# Patient Record
Sex: Male | Born: 1970 | Race: White | Hispanic: No | Marital: Married | Smoking: Current some day smoker
Health system: Southern US, Community
[De-identification: ages and names within clinical notes are randomized; demographics above are authoritative.]

## PROBLEM LIST (undated history)

## (undated) DIAGNOSIS — K5792 Diverticulitis of intestine, part unspecified, without perforation or abscess without bleeding: Secondary | ICD-10-CM

## (undated) DIAGNOSIS — N200 Calculus of kidney: Secondary | ICD-10-CM

## (undated) HISTORY — PX: WRIST ARTHROSCOPY: SHX838

---
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
## 2004-04-30 HISTORY — PX: OTHER SURGICAL HISTORY: SHX169

## 2009-04-30 HISTORY — PX: CYSTOSCOPY: SUR368

## 2009-04-30 HISTORY — PX: OTHER SURGICAL HISTORY: SHX169

## 2009-12-22 ENCOUNTER — Ambulatory Visit: Payer: Self-pay | Admitting: Sports Medicine

## 2009-12-22 DIAGNOSIS — M25569 Pain in unspecified knee: Secondary | ICD-10-CM

## 2009-12-22 DIAGNOSIS — IMO0002 Reserved for concepts with insufficient information to code with codable children: Secondary | ICD-10-CM

## 2009-12-29 ENCOUNTER — Ambulatory Visit: Payer: Self-pay | Admitting: Sports Medicine

## 2010-02-02 ENCOUNTER — Ambulatory Visit: Payer: Self-pay | Admitting: Sports Medicine

## 2010-03-20 ENCOUNTER — Ambulatory Visit: Payer: Self-pay | Admitting: Sports Medicine

## 2010-05-30 NOTE — Assessment & Plan Note (Signed)
Summary: FU KNEE PAIN/MJD   Vital Signs:  Patient profile:   40 year old male BP sitting:   127 / 84  Vitals Entered By: Lillia Pauls CMA (December 29, 2009 1:41 PM)  History of Present Illness: 1. F/U left knee injury - Pt with a left knee injury 2 weeks ago.  This happened when he was playing basketball.  Diagnosed with a left knee medial meniscus irritation.  At his visit last wee conservative treatment was prescribed including rest, quad strengthening exercises, pain medications, and a supporting knee brace.  He is 20% better since then.  He has increased range of motion and can now fully extend his knee.  He has been doing his strengthening exercises.  He is able to ambulate on his knee but he does have to be cautious.  He does notice that under his patella he feels a catching and instability occassionally.  He hasn't noticed the knee giving out.  The swelling has also improved.   Current Medications (verified): 1)  Vicodin 5-500 Mg Tabs (Hydrocodone-Acetaminophen) .... 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  well appearing, no acute distress Lungs:  normal respiratory effort.  normal respiratory effort.   Msk:  Left Knee: Minimal medial effusio. No erythema or warmth to palpation. Medial joint line tenderness, with point tenderness at anteriomedial joint line. No tenderness along patella or patellar tendon. Full knee extension actively and passively.  Flexion to 120 degrees.. ACL, PCL, and collateral ligaments intact.  Positive bounce / hold test.  Negative McMurray's.  5/5 strength on hip flexion, knee extension and flexion Patient's gait favors his right leg and he flexes his left knee with caution.  Pulses:  2+ DP pulses Extremities:  no lower extremity edema   Impression & Recommendations:  Problem # 1:  MEDIAL MENISCUS TEAR, LEFT  (ICD-836.0) Assessment Improved 20% improved with conservative management.  Will continue with conservative management for now.  Continue quad strengthening exercises and use of knee brace.  Encouraged him to use stationary bike to work on decreasing the effusion.  Follow up in clinic in 2 weeks.  Problem # 2:  KNEE PAIN, LEFT (ICD-719.46)  His updated medication list for this problem includes:    Vicodin 5-500 Mg Tabs (Hydrocodone-acetaminophen) .Marland Kitchen... Take one tab q4-6 hours  this is improved but still needs pain meds once or twice daily  Complete Medication List: 1)  Vicodin 5-500 Mg Tabs (Hydrocodone-acetaminophen) .... Take one tab q4-6 hours Prescriptions: VICODIN 5-500 MG TABS (HYDROCODONE-ACETAMINOPHEN) take one tab q4-6 hours  #40 x 0   Entered by:   Lillia Pauls CMA   Authorized by:   Enid Baas MD   Signed by:   Lillia Pauls CMA on 12/29/2009   Method used:   Print then Give to Patient   RxID:   [id number] VICODIN 5-500 MG TABS (HYDROCODONE-ACETAMINOPHEN) take one tab q4-6 hours  #40 x 0   Entered by:   Lillia Pauls CMA   Authorized by:   Enid Baas MD   Signed by:   Lillia Pauls CMA on 12/29/2009   Method used:   Print then Give to Patient   RxID:   [id number]

## 2010-05-30 NOTE — Assessment & Plan Note (Signed)
Summary: F/U meniscal tear   Vital Signs:  Patient profile:   40 year old male BP sitting:   128 / 80  Vitals Entered By: Lillia Pauls CMA (March 20, 2010 8:36 AM)  CC:  left meniscal tear improved.  History of Present Illness: 40 y/o M who initially injured himself playing basketball. He had a left meniscal tear that has improved with conservative treatment. He is still doing his exercises. He is feeling about 10-15% improved since his last visit. However, he can still not squat comfortably. He did fall in a hole about 10 days ago while working in his yard. He did not have any soreness but feels like he hyperextended his knee a little.   Current Medications (verified): 1)  None  Review of Systems          Physical Exam  General:  Well-developed,well-nourished,in no acute distress; alert,appropriate and cooperative throughout examination Msk:  no joint swelling, no warmth or erythema. no effusion.  neg Thesselay neg McMurray's  neg Lachman's tests.    Impression & Recommendations:  Problem # 1:  MEDIAL MENISCUS TEAR, LEFT (ICD-836.0) Assessment Improved Pt is feeling much better. Plan to see him back as needed. At this point he can start playing some light basketball and shooting. See instructions for further details.   Problem # 2:  KNEE PAIN, LEFT (ICD-719.46) Assessment: Improved Pt's pain is improved. He has not had to take any Vicodin. Plan to d/c this med from his list.   The following medications were removed from the medication list:    Vicodin 5-500 Mg Tabs (Hydrocodone-acetaminophen) .Marland Kitchen... Take one tab q4-6 hours  Patient Instructions: 1)  Remember to not go more than 45 degrees on squats or lunges.  2)  You can start shooting basketball and doing a few drills while using the brace.  3)  Remember to use the stationary bike for some fitness training.  4)  Walking in the woods on trails is fine too.  5)  Be careful to not allow it to do any sudden twists.      Orders Added: 1)  Est. Patient Level III [16109]

## 2010-05-30 NOTE — Assessment & Plan Note (Signed)
Summary: f/u,mc   Vital Signs:  Patient profile:   40 year old male BP sitting:   126 / 85  Vitals Entered By: Lillia Pauls CMA (February 02, 2010 8:55 AM)  History of Present Illness: 40 yo M with L meniscal tear 6 weeks ago here for follow up. Patient has been doing quad strengthening as well as walking. Reports 25% improvement. Still feels instability in L knee with squatting, going down stairs and going down hills. Patient had one episode where he was pushing a shopping cart and had the knee give out. He was able to catch himself and did not have pain or swelling after the incident.  Physical Exam  General:  Well-developed,well-nourished,in no acute distress; alert,appropriate and cooperative throughout examination Msk:  Knee: Knees symmetric. L knee without swelling or evidence of effusion from previous exam. On passive extension, patient still 5 degrees from full extension, but improved. Able to achieve full extension with light pressure and when examiner supports leg behind heel.  5/5 strength with flexion/extension at knee.  McMurray's negative, clicking behind kneecap not in meniscus. Lachman's negative, Drawer negative.  Thessaly test with pain of 1-2.   Impression & Recommendations:  Problem # 1:  MEDIAL MENISCUS TEAR, LEFT (ICD-836.0) Improvement with exercises and rest. Adivsed patient to begin dynamic exercises including one-leg step up, one-leg step down, side step up and cone touch exercises. He will follow up in 6 weeks for recheck of strength, stability, pain. If continued improvement, may be able to try restarting basketball.  Over next year will suggest using [employer] for sports  Complete Medication List: 1)  Vicodin 5-500 Mg Tabs (Hydrocodone-acetaminophen) .... Take one tab q4-6 hours

## 2010-05-30 NOTE — Assessment & Plan Note (Signed)
Summary: NP L KNEE PAIN X THURS PLAYING BBALL/MJD   Vital Signs:  Patient profile:   40 year old male Height:      74 inches Weight:      217 pounds BMI:     27.96 BP sitting:   127 / 93  Vitals Entered By: Lillia Pauls CMA (December 22, 2009 11:11 AM)  History of Present Illness: Mr. Eric Ayers is a 40 year old male who presents today with left knee pain. The pain started one week ago while playing basketball, without any specific inciting injury. The injury probably related to some stiffness he felt in 4 th game. played a 5th game and very stiff on ride home.  The next day he noted marked soreness and swelling in that knee. He had some improvement over the weekend, but continues to experience diffuse knee pain with some tenderness along the medial aspect of the knee. His pain is worst with going down stairs or squatting and he has noticed that he cannot fully extend the knee. He has had a few episodes in which the knee clicks and seems to be weak or give-way.   Mr. [name] has had no previous injuries or surgery to his left knee.  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 No erythema or warmth to palpation. Joint line tenderness medial>lateral, with point tenderness at anteriomedial joint line. No tenderness along patella or patellar tendon. Knee extension to -5 degrees and limited flexion as well. ACL, PCL, and collateral ligaments intact. Negative Lachman. McMurray's test elicited some pain and Thessaly test induces significant pain at medial knee. 5/5 strength on hip flexion, knee extension and flexion Patient's gait favors his right leg and he flexes his left knee with caution. Additional Exam:  Right Knee [place]: Moderate suprapatellar effusion. Normal patellar tendon.Norm quad tendon.   Medial meniscus shows some calcification and an area of splitting without significantly increased vascularity on doppler. Lateral meniscus appears normal.  Images saved.    Impression & Recommendations:  Problem # 1:  KNEE PAIN, LEFT (EAV-409.81) Mr. Samples presents with a history of left knee pain and swelling and signs of effusion, joint line tenderness, and ultrasound findings that suggest his knee pain is the result of irritation of his meniscus. We will manage conservatively for one week: ice, brace, quadricpes strenghtening. Then we will see him back to evaluate his range of motion and determine the need for surgical evaluation. Orders: Patella / Knee brace ([id number]) [place] LIMITED ([id number])  Problem # 2:  MEDIAL MENISCUS TEAR, LEFT (ICD-836.0)  The patient has a history of chronic mild left knee pain with a more acute onset of pain and swelling that is consistent with an aggrivation or tearing of a damaged medial meniscus. The patient is to start wearing a patellar knee brace to help stabilize the knee and reduce the edema over the next week. He is also to continue to ice the knee and begin quad sets and straight leg lifts to strengthen his quadriceps and improve his range of motion. Mr. [name] will return to clinic in one week to evaluate for improvement in swelling and range of motion. If he remains restricted in extension, we will consider referral for possible further imaging or arthroscopy.  Orders: [place] LIMITED ([id number])  Patient Instructions: 1)  You appear to have some tearing and irritation of  your left medial meniscus with some moderate knee swelling. 2)  For the next week, continue to ice to bring down the swelling, avoid steps and rest. 3)  Do quad sets and straight leg lifts each day. 4)  Wear your knee brace at all times except while sleeping. 5)  Please return to clinic in one week in order to assess the swelling and your range of motion.

## 2010-07-10 ENCOUNTER — Ambulatory Visit (HOSPITAL_COMMUNITY)
Admission: RE | Admit: 2010-07-10 | Discharge: 2010-07-10 | Disposition: A | Discharge status: Home / Self Care | Payer: 59 | Source: Ambulatory Visit | Attending: Urology | Admitting: Urology

## 2010-07-10 DIAGNOSIS — N200 Calculus of kidney: Secondary | ICD-10-CM | POA: Insufficient documentation

## 2010-07-23 ENCOUNTER — Emergency Department (HOSPITAL_COMMUNITY): Payer: 59

## 2010-07-23 ENCOUNTER — Emergency Department (INDEPENDENT_AMBULATORY_CARE_PROVIDER_SITE_OTHER): Payer: 59

## 2010-07-23 ENCOUNTER — Emergency Department (HOSPITAL_BASED_OUTPATIENT_CLINIC_OR_DEPARTMENT_OTHER)
Admission: EM | Admit: 2010-07-23 | Discharge: 2010-07-23 | Disposition: A | Discharge status: Nursing Home (Includes ICF) | Payer: 59 | Attending: Emergency Medicine | Admitting: Emergency Medicine

## 2010-07-23 ENCOUNTER — Ambulatory Visit (HOSPITAL_COMMUNITY)
Admission: EM | Admit: 2010-07-23 | Discharge: 2010-07-23 | Disposition: A | Discharge status: Home / Self Care | Payer: 59 | Attending: Emergency Medicine | Admitting: Emergency Medicine

## 2010-07-23 DIAGNOSIS — N200 Calculus of kidney: Secondary | ICD-10-CM | POA: Insufficient documentation

## 2010-07-23 DIAGNOSIS — R1110 Vomiting, unspecified: Secondary | ICD-10-CM

## 2010-07-23 DIAGNOSIS — N201 Calculus of ureter: Secondary | ICD-10-CM | POA: Insufficient documentation

## 2010-07-23 DIAGNOSIS — R109 Unspecified abdominal pain: Secondary | ICD-10-CM | POA: Insufficient documentation

## 2010-07-23 DIAGNOSIS — Z01810 Encounter for preprocedural cardiovascular examination: Secondary | ICD-10-CM | POA: Insufficient documentation

## 2010-07-23 LAB — CBC
HCT: 42.6 % (ref 39.0–52.0)
MCH: 29.7 pg (ref 26.0–34.0)
MCV: 85 fL (ref 78.0–100.0)
RBC: 5.01 MIL/uL (ref 4.22–5.81)
RDW: 12.2 % (ref 11.5–15.5)
WBC: 17 10*3/uL — ABNORMAL HIGH (ref 4.0–10.5)

## 2010-07-23 LAB — BASIC METABOLIC PANEL
BUN: 16 mg/dL (ref 6–23)
Calcium: 9 mg/dL (ref 8.4–10.5)
Chloride: 104 mEq/L (ref 96–112)
GFR calc non Af Amer: 52 mL/min — ABNORMAL LOW (ref >60)
Glucose, Bld: 116 mg/dL — ABNORMAL HIGH (ref 70–99)
Potassium: 4.1 mEq/L (ref 3.5–5.1)

## 2010-07-23 LAB — DIFFERENTIAL
Eosinophils Absolute: 0.1 10*3/uL (ref 0.0–0.7)
Eosinophils Relative: 1 % (ref 0–5)
Lymphocytes Relative: 8 % — ABNORMAL LOW (ref 12–46)
Lymphs Abs: 1.4 10*3/uL (ref 0.7–4.0)
Monocytes Relative: 9 % (ref 3–12)

## 2010-07-23 LAB — URINALYSIS, ROUTINE W REFLEX MICROSCOPIC
Hgb urine dipstick: NEGATIVE
Nitrite: NEGATIVE
Specific Gravity, Urine: 1.019 (ref 1.005–1.030)
Urobilinogen, UA: 0.2 mg/dL (ref 0.0–1.0)

## 2010-07-31 ENCOUNTER — Ambulatory Visit (HOSPITAL_COMMUNITY)
Admission: RE | Admit: 2010-07-31 | Discharge: 2010-07-31 | Disposition: A | Discharge status: Home / Self Care | Payer: 59 | Source: Ambulatory Visit | Attending: Urology | Admitting: Urology

## 2010-07-31 DIAGNOSIS — Z79899 Other long term (current) drug therapy: Secondary | ICD-10-CM | POA: Insufficient documentation

## 2010-07-31 DIAGNOSIS — R109 Unspecified abdominal pain: Secondary | ICD-10-CM | POA: Insufficient documentation

## 2010-07-31 DIAGNOSIS — N201 Calculus of ureter: Secondary | ICD-10-CM | POA: Insufficient documentation

## 2010-07-31 DIAGNOSIS — M129 Arthropathy, unspecified: Secondary | ICD-10-CM | POA: Insufficient documentation

## 2010-07-31 DIAGNOSIS — N281 Cyst of kidney, acquired: Secondary | ICD-10-CM | POA: Insufficient documentation

## 2010-07-31 DIAGNOSIS — N200 Calculus of kidney: Secondary | ICD-10-CM | POA: Insufficient documentation

## 2010-07-31 DIAGNOSIS — Z87442 Personal history of urinary calculi: Secondary | ICD-10-CM | POA: Insufficient documentation

## 2010-08-09 ENCOUNTER — Ambulatory Visit (HOSPITAL_BASED_OUTPATIENT_CLINIC_OR_DEPARTMENT_OTHER)
Admission: RE | Admit: 2010-08-09 | Discharge: 2010-08-09 | Disposition: A | Discharge status: Home / Self Care | Payer: 59 | Source: Ambulatory Visit | Attending: Urology | Admitting: Urology

## 2010-08-09 DIAGNOSIS — N201 Calculus of ureter: Secondary | ICD-10-CM | POA: Insufficient documentation

## 2010-08-09 DIAGNOSIS — N200 Calculus of kidney: Secondary | ICD-10-CM | POA: Insufficient documentation

## 2010-08-10 LAB — POCT HEMOGLOBIN-HEMACUE: Hemoglobin: 14.9 g/dL (ref 13.0–17.0)

## 2010-08-15 NOTE — Op Note (Signed)
NAMECONWAY, FEDORA               ACCOUNT NO.:  [id number]  MEDICAL RECORD NO.:  [id number]           PATIENT TYPE:  LOCATION:                                 FACILITY:  PHYSICIAN:  Bertram Millard. Jamayia Croker, M.D.DATE OF BIRTH:  April 17, 1971  DATE OF PROCEDURE:  07/23/2010 DATE OF DISCHARGE:                              OPERATIVE REPORT   PREOPERATIVE DIAGNOSIS:  Obstructing left proximal ureteral stones.  POSTOPERATIVE DIAGNOSIS:  Obstructing left proximal ureteral stones.  PRINCIPAL PROCEDURE:  Cystoscopy, left retrograde ureteral pyelogram, double-J stent placement (6-French x 26 cm without string).  SURGEON:  Bertram Millard. Pieper Kasik, M.D.  ANESTHESIA:  General with LMA.  COMPLICATIONS:  None.  BRIEF HISTORY:  A 40 year old male about 2 weeks out from lithotripsy of 12 mm proximal left ureteral stone.  The patient has had significant pain over the past 24 to 48 hours, necessitating emergency room visits, and then eventual transferred to the [facility] emergency room for further management.  He has had quite a bit of Dilaudid today, as well as Toradol.  The patient still has pain.  He has 2 proximal left ureteral stones, with significant hydronephrosis.  He also has a large left parapelvic cyst.  It was recommended that the patient have double-J stent placement, as he has not fit to be a lithotripsy candidate tomorrow because the Toradol administration recently.  Risks and complications of double-J stent placement were discussed with the patient and his wife.  They understand these and desire to proceed.  DESCRIPTION OF PROCEDURE:  The patient was identified in the holding area, the surgical side was marked and he received preoperative IV Ancef.  He was taken to the operating room where general anesthetic was administered with LMA.  Placed in the dorsal lithotomy position. Genitalia and perineum were prepped and draped.  Time-out was then performed.  Procedure was then  commenced.  A 22-French panendoscope was advanced under direct vision through his urethra.  Urethra without lesions or strictures.  His prostate was nonobstructive.  Bladder was entered and inspected circumferentially.  No tumors, trabeculations, or foreign bodies were noted.  A left ureteral orifice was cannulated with an open- ended catheter and retrograde performed.  This showed a totally normal left mid distal ureter.  Proximally, there was at least one filling defect with a moderate left pelvocaliectasis.  I then guided a guidewire by this filling defect, and over top of this, after the open-ended catheter was removed, I placed a 6-French x 26 cm contour stent.  Good proximal and distal curls were seen when the guidewire was removed.  The bladder was then drained, the procedure terminated.  The patient tolerated the procedure well.  He was awakened and then taken to the PACU in stable condition.  He will follow up with lithotripsy later this week with Dr. Vonita Moss. He was discharged on Vesicare 5 mg daily, p.r.n. Zofran, Percocet, and 3 days of Bactrim DS one p.o. b.i.d.   Bertram Millard. Retta Diones, M.D.     SMD/MEDQ  D:  07/23/2010  T:  07/24/2010  Job:  [id number]  Electronically Signed by Marcine Matar M.D. on  08/15/2010 01:18:29 PM

## 2010-08-17 NOTE — Op Note (Signed)
Eric Ayers, Eric Ayers               ACCOUNT NO.:  [id number]  MEDICAL RECORD NO.:  [id number]           PATIENT TYPE:  LOCATION:                                 FACILITY:  PHYSICIAN:  Maretta Bees. Vonita Moss, M.D.DATE OF BIRTH:  [birth year]  DATE OF PROCEDURE:  08/09/2010 DATE OF DISCHARGE:                              OPERATIVE REPORT   PREOPERATIVE DIAGNOSIS:  Left ureteral and renal calculi.  POSTOPERATIVE DIAGNOSIS:  Left ureteral and renal calculi.  PROCEDURE:  Cystoscopy, left ureteroscopy, laser fragmentation and basketing of left upper ureteral and left renal calculi, left retrograde pyelogram with interpretation and insertion of left double-J catheter.  SURGEON:  Maretta Bees. Vonita Moss, M.D.  ANESTHESIA:  General.  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  A large 6- mm fragment was obstructed in the left upper ureter and he had placement of a double-J catheter.  He had repeat lithotripsy with only partial fragmentation of the stone.  He has a double-J catheter in and he is uncomfortable with that.  KUB showed large residual stone fragment in the left upper ureter adjacent to the stent and a cluster of small lower pole renal calculi.  He is brought to the OR today for further treatment and evaluation and was advised about postop bleeding, infection, etc. He was also told about possibility of double-J catheter.  DESCRIPTION OF PROCEDURE:  The patient was brought to the operating room and placed in lithotomy position.  External genitalia prepped and draped in usual fashion.  He was cystoscoped and the distal end of the left double-J catheter was grasped with a grasper and brought out per penis. Through the double-J catheter, I inserted a retractable core guidewire to just below the stone.  I then inserted a digital ureteroscopic access sheath.  I then used the digital ureteroscope and  found a golden yellow stone in the left upper ureter.  There was ureteral inflammation and edema.  There was one stone fragment that I removed with the basket and then the rest of stone, although partially fragmented, was not small enough to remove intact.  Therefore, I used the laser and broke up the stone in several small pieces which were then retrieved through the access sheath with a basket.  At this point, the upper ureter was inflamed but there was no evidence of perforation and later on a retrograde pyelogram showed no evidence of extravasation in the ureter. I then manipulated the ureteroscope into the kidney and basketed three small stones out of the lower pole calix.  There were 2 other tiny pieces of stone that would be too small to basket but the larger fragments were removed from the kidney.  Then I reobserved the ureter and again it looked inflamed but intact.  I reinjected contrast and there was some partial fullness of the pyelocaliceal system with no extravasation in the kidney or ureter.  I then back loaded a retractable guidewire placed through the access sheath and then back loaded it through the cystoscope so I could place a 6-French 26-cm double-J catheter with  full coil in the renal collecting system and a full coil in the bladder and string brought out per urethra which was taped to the penis with anesthesia tape.  Stone fragments were later given to the patient.  He was taken to recovery room in good condition having tolerated the procedure well with minimal bleeding.     Maretta Bees. Vonita Moss, M.D.     LJP/MEDQ  D:  08/09/2010  T:  08/09/2010  Job:  [id number]  Electronically Signed by Larey Dresser M.D. on 08/17/2010 05:55:51 PM

## 2015-08-30 ENCOUNTER — Encounter (HOSPITAL_BASED_OUTPATIENT_CLINIC_OR_DEPARTMENT_OTHER): Payer: Self-pay | Admitting: *Deleted

## 2015-08-30 ENCOUNTER — Emergency Department (HOSPITAL_BASED_OUTPATIENT_CLINIC_OR_DEPARTMENT_OTHER): Payer: BLUE CROSS/BLUE SHIELD

## 2015-08-30 ENCOUNTER — Emergency Department (HOSPITAL_BASED_OUTPATIENT_CLINIC_OR_DEPARTMENT_OTHER)
Admission: EM | Admit: 2015-08-30 | Discharge: 2015-08-31 | Disposition: A | Discharge status: Home / Self Care | Payer: BLUE CROSS/BLUE SHIELD | Attending: Emergency Medicine | Admitting: Emergency Medicine

## 2015-08-30 DIAGNOSIS — K5732 Diverticulitis of large intestine without perforation or abscess without bleeding: Secondary | ICD-10-CM

## 2015-08-30 DIAGNOSIS — R1030 Lower abdominal pain, unspecified: Secondary | ICD-10-CM | POA: Insufficient documentation

## 2015-08-30 DIAGNOSIS — F17200 Nicotine dependence, unspecified, uncomplicated: Secondary | ICD-10-CM | POA: Diagnosis not present

## 2015-08-30 DIAGNOSIS — R6883 Chills (without fever): Secondary | ICD-10-CM | POA: Insufficient documentation

## 2015-08-30 HISTORY — DX: Calculus of kidney: N20.0

## 2015-08-30 LAB — URINALYSIS, ROUTINE W REFLEX MICROSCOPIC
Bilirubin Urine: NEGATIVE
Glucose, UA: NEGATIVE mg/dL
KETONES UR: 15 mg/dL — AB
LEUKOCYTES UA: NEGATIVE
NITRITE: NEGATIVE
PH: 6.5 (ref 5.0–8.0)
PROTEIN: 30 mg/dL — AB
Specific Gravity, Urine: 1.015 (ref 1.005–1.030)

## 2015-08-30 LAB — CBC
HEMATOCRIT: 44.4 % (ref 39.0–52.0)
Hemoglobin: 15.1 g/dL (ref 13.0–17.0)
MCH: 30.9 pg (ref 26.0–34.0)
MCHC: 34 g/dL (ref 30.0–36.0)
MCV: 90.8 fL (ref 78.0–100.0)
Platelets: 381 10*3/uL (ref 150–400)
RBC: 4.89 MIL/uL (ref 4.22–5.81)
RDW: 12.6 % (ref 11.5–15.5)
WBC: 13.2 10*3/uL — AB (ref 4.0–10.5)

## 2015-08-30 LAB — LIPASE, BLOOD: Lipase: 41 U/L (ref 11–51)

## 2015-08-30 LAB — COMPREHENSIVE METABOLIC PANEL
ALT: 15 U/L — ABNORMAL LOW (ref 17–63)
ANION GAP: 9 (ref 5–15)
AST: 15 U/L (ref 15–41)
Albumin: 4.2 g/dL (ref 3.5–5.0)
Alkaline Phosphatase: 48 U/L (ref 38–126)
BILIRUBIN TOTAL: 1.2 mg/dL (ref 0.3–1.2)
BUN: 11 mg/dL (ref 6–20)
CO2: 25 mmol/L (ref 22–32)
Calcium: 8.7 mg/dL — ABNORMAL LOW (ref 8.9–10.3)
Chloride: 105 mmol/L (ref 101–111)
Creatinine, Ser: 0.96 mg/dL (ref 0.61–1.24)
GFR calc Af Amer: >60 mL/min (ref >60)
Glucose, Bld: 96 mg/dL (ref 65–99)
POTASSIUM: 3.6 mmol/L (ref 3.5–5.1)
Sodium: 139 mmol/L (ref 135–145)
TOTAL PROTEIN: 7.7 g/dL (ref 6.5–8.1)

## 2015-08-30 LAB — URINE MICROSCOPIC-ADD ON: Bacteria, UA: NONE SEEN

## 2015-08-30 MED ORDER — IOPAMIDOL (ISOVUE-300) INJECTION 61%
100.0000 mL | Freq: Once | INTRAVENOUS | Status: AC | PRN
  Administered 2015-08-30: 100 mL via INTRAVENOUS

## 2015-08-30 MED ORDER — CIPROFLOXACIN HCL 500 MG PO TABS: 500.0000 mg | ORAL_TABLET | Freq: Two times a day (BID) | ORAL | Status: DC

## 2015-08-30 MED ORDER — METRONIDAZOLE 500 MG PO TABS
500.0000 mg | ORAL_TABLET | Freq: Two times a day (BID) | ORAL | Status: DC
Start: 2015-08-30 — End: 2020-11-10

## 2015-08-30 MED ORDER — CIPROFLOXACIN HCL 500 MG PO TABS
500.0000 mg | ORAL_TABLET | Freq: Once | ORAL | Status: AC
  Administered 2015-08-30: 500 mg via ORAL
  Filled 2015-08-30: qty 1

## 2015-08-30 MED ORDER — HYDROCODONE-ACETAMINOPHEN 5-325 MG PO TABS: 1.0000 | ORAL_TABLET | Freq: Four times a day (QID) | ORAL | Status: DC | PRN

## 2015-08-30 MED ORDER — METRONIDAZOLE 500 MG PO TABS
500.0000 mg | ORAL_TABLET | Freq: Once | ORAL | Status: AC
  Administered 2015-08-30: 500 mg via ORAL
  Filled 2015-08-30: qty 1

## 2015-08-30 NOTE — ED Notes (Signed)
Patient transported to CT 

## 2015-08-30 NOTE — ED Notes (Signed)
Pt c/o left side abdominal tenderness and left flank pain since 0300.  Denies n/v/d, denies dysuria, hx of kidney stones but states this does not feel like a stone.

## 2015-08-30 NOTE — ED Notes (Addendum)
Pt c/o left lower abd pain x 2 days denies n/v/d

## 2015-08-30 NOTE — ED Provider Notes (Signed)
CSN: [id number]     Arrival date & time 08/30/15  1900 History  By signing my name below, I, Freida Busmaniana Omoyeni, attest that this documentation has been prepared under the direction and in the presence of Doug SouSam Kiowa Peifer, MD . Electronically Signed: Freida Busmaniana Omoyeni, Scribe. 08/30/2015. 9:21 PM.    Chief Complaint  Patient presents with  . Abdominal Pain   The history is provided by the patient. No language interpreter was used.   HPI Comments:  Eric Ayers is a 45 y.o. male who presents to the Emergency Department complaining of sharp, constant, left sided abdominal pain, onset ~0300 yesterday AM. His pain is exacerbated with palpation of the site and By standing upright of his left abdomen. Improved by flexing at the waist. Last bowel movement today to, normal He reports associated decreased appetite and occasional chills. He denies fever, nausea, and vomiting. His last BM was today, notes less stool than usual but normal otherwise.  [facility] and had a negative XR. Pt has a h/o kidney stones and notes his pain today is dissimilar. No alleviating factors noted.   Past Medical History  Diagnosis Date  . Kidney stone    Past Surgical History  Procedure Laterality Date  . Wrist arthroscopy     History reviewed. No pertinent family history. Social History  Substance Use Topics  . Smoking status: Current Some Day Smoker  . Smokeless tobacco: None  . Alcohol Use: No    Review of Systems  Constitutional: Positive for chills and appetite change. Negative for fever.  HENT: Negative.   Respiratory: Negative.   Cardiovascular: Negative.   Gastrointestinal: Positive for abdominal pain. Negative for nausea, vomiting and diarrhea.  Musculoskeletal: Negative.   Skin: Negative.   Neurological: Negative.   Psychiatric/Behavioral: Negative.   All other systems reviewed and are negative.  Allergies  Review of patient's allergies indicates no known  allergies.  Home Medications   Prior to Admission medications   Medication Sig Start Date End Date Taking? Authorizing Provider  zolpidem (AMBIEN) 10 MG tablet Take 10 mg by mouth at bedtime as needed for sleep.   Yes Historical Provider, MD   BP 129/87 mmHg  Pulse 86  Temp(Src) 99 F (37.2 C) (Oral)  Resp 18  Ht 6\' 2"  (1.88 m)  Wt 210 lb (95.255 kg)  BMI 26.95 kg/m2  SpO2 100% Physical Exam  Constitutional: He appears well-developed and well-nourished.  HENT:  Head: Normocephalic and atraumatic.  Eyes: Conjunctivae are normal. Pupils are equal, round, and reactive to light.  Neck: Neck supple. No tracheal deviation present. No thyromegaly present.  Cardiovascular: Normal rate and regular rhythm.   No murmur heard. Pulmonary/Chest: Effort normal and breath sounds normal.  Abdominal: Soft. Bowel sounds are normal. He exhibits no distension and no mass. There is tenderness. There is no rebound and no guarding.  Tender at left upper quadrant and left lower quadrant  Genitourinary: Penis normal.  Scrotum normal  Musculoskeletal: Normal range of motion. He exhibits no edema or tenderness.  Neurological: He is alert. Coordination normal.  Skin: Skin is warm and dry. No rash noted.  Psychiatric: He has a normal mood and affect.  Nursing note and vitals reviewed.   ED Course  Procedures  DIAGNOSTIC STUDIES:  Oxygen Saturation is 100% on RA, normal by my interpretation.    COORDINATION OF CARE:  9:12 PM Discussed treatment plan with pt at bedside and pt agreed to plan.  Labs Review  Labs Reviewed  CBC - Abnormal; Notable for the following:    WBC 13.2 (*)    All other components within normal limits  LIPASE, BLOOD  COMPREHENSIVE METABOLIC PANEL  URINALYSIS, ROUTINE W REFLEX MICROSCOPIC (NOT AT [facility])    Imaging Review No results found. I have personally reviewed and evaluated these images and lab results as part of my medical decision-making.   EKG  Interpretation None     Declines pain medicine  11:30 PM patient resting comfortably. Results for orders placed or performed during the hospital encounter of 08/30/15  Lipase, blood  Result Value Ref Range   Lipase 41 11 - 51 U/L  Comprehensive metabolic panel  Result Value Ref Range   Sodium 139 135 - 145 mmol/L   Potassium 3.6 3.5 - 5.1 mmol/L   Chloride 105 101 - 111 mmol/L   CO2 25 22 - 32 mmol/L   Glucose, Bld 96 65 - 99 mg/dL   BUN 11 6 - 20 mg/dL   Creatinine, Ser 5.36 0.61 - 1.24 mg/dL   Calcium 8.7 (L) 8.9 - 10.3 mg/dL   Total Protein 7.7 6.5 - 8.1 g/dL   Albumin 4.2 3.5 - 5.0 g/dL   AST 15 15 - 41 U/L   ALT 15 (L) 17 - 63 U/L   Alkaline Phosphatase 48 38 - 126 U/L   Total Bilirubin 1.2 0.3 - 1.2 mg/dL   GFR calc non Af Amer >60 >60 mL/min   GFR calc Af Amer >60 >60 mL/min   Anion gap 9 5 - 15  CBC  Result Value Ref Range   WBC 13.2 (H) 4.0 - 10.5 K/uL   RBC 4.89 4.22 - 5.81 MIL/uL   Hemoglobin 15.1 13.0 - 17.0 g/dL   HCT 64.4 03.4 - 74.2 %   MCV 90.8 78.0 - 100.0 fL   MCH 30.9 26.0 - 34.0 pg   MCHC 34.0 30.0 - 36.0 g/dL   RDW 59.5 63.8 - 75.6 %   Platelets 381 150 - 400 K/uL  Urinalysis, Routine w reflex microscopic  Result Value Ref Range   Color, Urine YELLOW YELLOW   APPearance CLEAR CLEAR   Specific Gravity, Urine 1.015 1.005 - 1.030   pH 6.5 5.0 - 8.0   Glucose, UA NEGATIVE NEGATIVE mg/dL   Hgb urine dipstick SMALL (A) NEGATIVE   Bilirubin Urine NEGATIVE NEGATIVE   Ketones, ur 15 (A) NEGATIVE mg/dL   Protein, ur 30 (A) NEGATIVE mg/dL   Nitrite NEGATIVE NEGATIVE   Leukocytes, UA NEGATIVE NEGATIVE  Urine microscopic-add on  Result Value Ref Range   Squamous Epithelial / LPF 0-5 (A) NONE SEEN   WBC, UA 0-5 0 - 5 WBC/hpf   RBC / HPF 6-30 0 - 5 RBC/hpf   Bacteria, UA NONE SEEN NONE SEEN   Urine-Other MUCOUS PRESENT    Ct Abdomen Pelvis W Contrast  08/30/2015  CLINICAL DATA:  Left lateral abdominal pain for the past 2 days. Fever and chills.  History of multiple previous renal stones. EXAM: CT ABDOMEN AND PELVIS WITH CONTRAST TECHNIQUE: Multidetector CT imaging of the abdomen and pelvis was performed using the standard protocol following bolus administration of intravenous contrast. CONTRAST:  ISOVUE-300 IOPAMIDOL (ISOVUE-300) INJECTION 61% COMPARISON:  CT abdomen and pelvis- 11/04/2014 ; 07/23/2010 FINDINGS: Lower chest: Limited visualization of lower thorax demonstrates minimal dependent subpleural ground-glass atelectasis. There is minimal subsegmental atelectasis within in the right costophrenic angle. No focal airspace opacities. No pleural effusion. Normal heart size.  No  pericardial effusion. Hepatobiliary: Normal hepatic contour. No discrete hepatic lesions. Normal appearance of the gallbladder given degree distention. No intra extrahepatic biliary duct dilatation. No ascites. Pancreas: Normal in appearance Spleen: Normal in appearance Adrenals/Urinary Tract: Grossly unchanged appearance of the known exophytic approximately 8.3 cm left-sided parapelvic cyst with associated mass effect upon the adjacent left renal collecting system. No discrete right-sided renal lesions. Punctate (approximately 2 mm) nonobstructing stone within the interpolar aspect of the right kidney (coronal image 61, series 5). No discrete left-sided renal stones. No urinary obstruction or perinephric stranding. Normal appearance of the bilateral adrenal glands. Normal appearance of the urinary bladder given underdistention. Stomach/Bowel: Ingested enteric contrast extends to the level of the rectum. Colonic diverticulosis. There is ill-defined stranding surrounding E diverticulum within the posterior aspect of the mid descending colon (representative axial image 48, series 2, coronal image 47, series 5) with associated stranding along the left pericolic gutter compatible with acute uncomplicated diverticulitis. No evidence of perforation or definable/ drainable fluid  collection. No pneumoperitoneum, pneumatosis or portal venous gas. Normal appearance of the terminal ileum and retrocecal appendix. No evidence of enteric obstruction. Vascular/Lymphatic: Normal caliber the abdominal aorta. The major branch vessels of the abdominal aorta appear widely patent on this non CTA examination. No bulky retroperitoneal, mesenteric, pelvic or inguinal lymphadenopathy. Reproductive: Normal appearance of the prostate gland. No free fluid in the pelvic cul-de-sac. Other: Tiny mesenteric fat containing periumbilical hernia. Regional soft tissues appear otherwise normal. Musculoskeletal: No acute or aggressive osseous abnormalities. IMPRESSION: 1. Acute uncomplicated diverticulitis involving the posterior aspect of the mid descending colon. No evidence of perforation or definable/drainable fluid collection. 2. Solitary punctate (approximately 2 mm) nonobstructing stone within the interpolar aspect of the right kidney. 3. Unchanged large (approximately 8.3 cm) left-sided parapelvic cyst with associated mass effect upon the adjacent left renal collecting system. No evidence of renal obstruction Electronically Signed   By: Simonne Come M.D.   On: 08/30/2015 23:07    MDM  Patient is aware of renal cyst. It is followed by his urologist Dr.Dahlstedt.. He he is candidate for outpatient therapy with oral antibiotics as he's had no vomiting no fever and is not ill-appearing Plan prescriptions Cipro, Flagyl, Norco. Follow-up with PMD if not better in a week Diagnosis #1 diverticulitis #2 renal cyst Final diagnoses:  None      I personally performed the services described in this documentation, which was scribed in my presence. The recorded information has been reviewed and considered.    Doug Sou, MD 08/30/15 [phone]

## 2015-08-30 NOTE — Discharge Instructions (Signed)
Diverticulitis Take Tylenol for mild pain or the pain medicine prescribed for bad pain. Don't take Tylenol together with the pain medicine prescribed as the combination can be dangerous to your liver. See your doctor if not improving by next week. You should start to feel better in a few days. Return if you develop fever, vomiting or are concerned for any reason. Diverticulitis is inflammation or infection of small pouches in your colon that form when you have a condition called diverticulosis. The pouches in your colon are called diverticula. Your colon, or large intestine, is where water is absorbed and stool is formed. Complications of diverticulitis can include:  Bleeding.  Severe infection.  Severe pain.  Perforation of your colon.  Obstruction of your colon. CAUSES  Diverticulitis is caused by bacteria. Diverticulitis happens when stool becomes trapped in diverticula. This allows bacteria to grow in the diverticula, which can lead to inflammation and infection. RISK FACTORS People with diverticulosis are at risk for diverticulitis. Eating a diet that does not include enough fiber from fruits and vegetables may make diverticulitis more likely to develop. SYMPTOMS  Symptoms of diverticulitis may include:  Abdominal pain and tenderness. The pain is normally located on the left side of the abdomen, but may occur in other areas.  Fever and chills.  Bloating.  Cramping.  Nausea.  Vomiting.  Constipation.  Diarrhea.  Blood in your stool. DIAGNOSIS  Your health care provider will ask you about your medical history and do a physical exam. You may need to have tests done because many medical conditions can cause the same symptoms as diverticulitis. Tests may include:  Blood tests.  Urine tests.  Imaging tests of the abdomen, including X-rays and CT scans. When your condition is under control, your health care provider may recommend that you have a colonoscopy. A colonoscopy  can show how severe your diverticula are and whether something else is causing your symptoms. TREATMENT  Most cases of diverticulitis are mild and can be treated at home. Treatment may include:  Taking over-the-counter pain medicines.  Following a clear liquid diet.  Taking antibiotic medicines by mouth for 7-10 days. More severe cases may be treated at a hospital. Treatment may include:  Not eating or drinking.  Taking prescription pain medicine.  Receiving antibiotic medicines through an IV tube.  Receiving fluids and nutrition through an IV tube.  Surgery. HOME CARE INSTRUCTIONS   Follow your health care provider's instructions carefully.  Follow a full liquid diet or other diet as directed by your health care provider. After your symptoms improve, your health care provider may tell you to change your diet. He or she may recommend you eat a high-fiber diet. Fruits and vegetables are good sources of fiber. Fiber makes it easier to pass stool.  Take fiber supplements or probiotics as directed by your health care provider.  Only take medicines as directed by your health care provider.  Keep all your follow-up appointments. SEEK MEDICAL CARE IF:   Your pain does not improve.  You have a hard time eating food.  Your bowel movements do not return to normal. SEEK IMMEDIATE MEDICAL CARE IF:   Your pain becomes worse.  Your symptoms do not get better.  Your symptoms suddenly get worse.  You have a fever.  You have repeated vomiting.  You have bloody or black, tarry stools. MAKE SURE YOU:   Understand these instructions.  Will watch your condition.  Will get help right away if you are not doing  well or get worse.   This information is not intended to replace advice given to you by your health care provider. Make sure you discuss any questions you have with your health care provider.   Document Released: 01/24/2005 Document Revised: 04/21/2013 Document Reviewed:  03/11/2013 Elsevier Interactive Patient Education [employer]

## 2015-08-31 NOTE — ED Notes (Signed)
Pt verbalizes understanding of d/c instructions and denies any further needs at this time. 

## 2016-04-20 HISTORY — PX: OTHER SURGICAL HISTORY: SHX169

## 2020-03-18 ENCOUNTER — Emergency Department (HOSPITAL_BASED_OUTPATIENT_CLINIC_OR_DEPARTMENT_OTHER): Payer: 59

## 2020-03-18 ENCOUNTER — Other Ambulatory Visit: Payer: Self-pay

## 2020-03-18 ENCOUNTER — Encounter (HOSPITAL_BASED_OUTPATIENT_CLINIC_OR_DEPARTMENT_OTHER): Payer: Self-pay | Admitting: *Deleted

## 2020-03-18 ENCOUNTER — Emergency Department (HOSPITAL_BASED_OUTPATIENT_CLINIC_OR_DEPARTMENT_OTHER)
Admission: EM | Admit: 2020-03-18 | Discharge: 2020-03-18 | Disposition: A | Discharge status: Home / Self Care | Payer: 59 | Attending: Emergency Medicine | Admitting: Emergency Medicine

## 2020-03-18 DIAGNOSIS — F17200 Nicotine dependence, unspecified, uncomplicated: Secondary | ICD-10-CM | POA: Insufficient documentation

## 2020-03-18 DIAGNOSIS — R109 Unspecified abdominal pain: Secondary | ICD-10-CM | POA: Diagnosis present

## 2020-03-18 DIAGNOSIS — K5732 Diverticulitis of large intestine without perforation or abscess without bleeding: Secondary | ICD-10-CM | POA: Insufficient documentation

## 2020-03-18 DIAGNOSIS — K5792 Diverticulitis of intestine, part unspecified, without perforation or abscess without bleeding: Secondary | ICD-10-CM

## 2020-03-18 HISTORY — DX: Diverticulitis of intestine, part unspecified, without perforation or abscess without bleeding: K57.92

## 2020-03-18 LAB — COMPREHENSIVE METABOLIC PANEL
ALT: 31 U/L (ref 0–44)
AST: 20 U/L (ref 15–41)
Albumin: 4.5 g/dL (ref 3.5–5.0)
Alkaline Phosphatase: 44 U/L (ref 38–126)
Anion gap: 11 (ref 5–15)
BUN: 12 mg/dL (ref 6–20)
CO2: 26 mmol/L (ref 22–32)
Calcium: 9.3 mg/dL (ref 8.9–10.3)
Chloride: 103 mmol/L (ref 98–111)
Creatinine, Ser: 0.91 mg/dL (ref 0.61–1.24)
GFR, Estimated: >60 mL/min (ref >60)
Glucose, Bld: 91 mg/dL (ref 70–99)
Potassium: 4.2 mmol/L (ref 3.5–5.1)
Sodium: 140 mmol/L (ref 135–145)
Total Bilirubin: 0.7 mg/dL (ref 0.3–1.2)
Total Protein: 7.6 g/dL (ref 6.5–8.1)

## 2020-03-18 LAB — CBC WITH DIFFERENTIAL/PLATELET
Abs Immature Granulocytes: 0.03 10*3/uL (ref 0.00–0.07)
Basophils Absolute: 0.1 10*3/uL (ref 0.0–0.1)
Basophils Relative: 1 %
Eosinophils Absolute: 0.3 10*3/uL (ref 0.0–0.5)
Eosinophils Relative: 3 %
HCT: 50.5 % (ref 39.0–52.0)
Hemoglobin: 16.5 g/dL (ref 13.0–17.0)
Immature Granulocytes: 0 %
Lymphocytes Relative: 25 %
Lymphs Abs: 2.5 10*3/uL (ref 0.7–4.0)
MCH: 30.1 pg (ref 26.0–34.0)
MCHC: 32.7 g/dL (ref 30.0–36.0)
MCV: 92.2 fL (ref 80.0–100.0)
Monocytes Absolute: 0.6 10*3/uL (ref 0.1–1.0)
Monocytes Relative: 7 %
Neutro Abs: 6.5 10*3/uL (ref 1.7–7.7)
Neutrophils Relative %: 64 %
Platelets: 372 10*3/uL (ref 150–400)
RBC: 5.48 MIL/uL (ref 4.22–5.81)
RDW: 12.5 % (ref 11.5–15.5)
WBC: 9.9 10*3/uL (ref 4.0–10.5)
nRBC: 0 % (ref 0.0–0.2)

## 2020-03-18 LAB — OCCULT BLOOD X 1 CARD TO LAB, STOOL: Fecal Occult Bld: NEGATIVE

## 2020-03-18 LAB — LIPASE, BLOOD: Lipase: 38 U/L (ref 11–51)

## 2020-03-18 MED ORDER — IOHEXOL 300 MG/ML  SOLN
100.0000 mL | Freq: Once | INTRAMUSCULAR | Status: AC | PRN
  Administered 2020-03-18: 100 mL via INTRAVENOUS

## 2020-03-18 MED ORDER — AMOXICILLIN-POT CLAVULANATE 875-125 MG PO TABS: 1.0000 | ORAL_TABLET | Freq: Three times a day (TID) | ORAL | 0 refills | Status: DC

## 2020-03-18 MED ORDER — AMOXICILLIN-POT CLAVULANATE 875-125 MG PO TABS
1.0000 | ORAL_TABLET | Freq: Once | ORAL | Status: AC
  Administered 2020-03-18: 1 via ORAL
  Filled 2020-03-18: qty 1

## 2020-03-18 MED ORDER — AMOXICILLIN-POT CLAVULANATE 875-125 MG PO TABS: 1.0000 | ORAL_TABLET | Freq: Three times a day (TID) | ORAL | 0 refills | Status: AC

## 2020-03-18 NOTE — ED Provider Notes (Signed)
[facility] EMERGENCY DEPARTMENT Provider Note   CSN: [id number] Arrival date & time: 03/18/20  1355     History Chief Complaint  Patient presents with  . Abdominal Pain    Eric Ayers is a 49 y.o. male.  HPI   Patient is a 49 year old male with a history of diverticulitis as well as kidney stone who presents to the emergency department due to abdominal pain. Patient states he woke up with the symptoms around 12:30 AM this morning. His symptoms wax and wane. This morning his symptoms were also associated with an increased urge to defecate. During this time he had about 5-6 bouts of hematochezia that he describes as bright red blood in the toilet. No melena. He does confirm his history of diverticulitis but states that his current abdominal pain is diffuse and typically his diverticulitis is only in the left lower quadrant. He has also never had hematochezia in the past. He states his pain mildly alleviates after taking BMs. No diarrhea or loose stools. His abdominal pain has been waxing and waning and worsening about every 35 minutes. He is not anticoagulated. No significant drinking history or NSAID use. Does not have a gastroenterologist. No other complaints at this time.      Past Medical History:  Diagnosis Date  . Diverticulitis   . Kidney stone     Patient Active Problem List   Diagnosis Date Noted  . KNEE PAIN, LEFT 12/22/2009  . MEDIAL MENISCUS TEAR, LEFT 12/22/2009    Past Surgical History:  Procedure Laterality Date  . WRIST ARTHROSCOPY         No family history on file.  Social History   Tobacco Use  . Smoking status: Current Some Day Smoker  Substance Use Topics  . Alcohol use: No  . Drug use: No    Home Medications Prior to Admission medications   Medication Sig Start Date End Date Taking? Authorizing Provider  ciprofloxacin (CIPRO) 500 MG tablet Take 1 tablet (500 mg total) by mouth 2 (two) times daily. One po bid x 7 days 08/30/15    Doug Sou, MD  HYDROcodone-acetaminophen (NORCO) 5-325 MG tablet Take 1-2 tablets by mouth every 6 (six) hours as needed for moderate pain or severe pain. 08/30/15   Doug Sou, MD  metroNIDAZOLE (FLAGYL) 500 MG tablet Take 1 tablet (500 mg total) by mouth 2 (two) times daily. One po bid x 7 days 08/30/15   Doug Sou, MD  zolpidem (AMBIEN) 10 MG tablet Take 10 mg by mouth at bedtime as needed for sleep.    [provider]    Allergies    Patient has no known allergies.  Review of Systems   Review of Systems  All other systems reviewed and are negative. Ten systems reviewed and are negative for acute change, except as noted in the HPI.    Physical Exam Updated Vital Signs BP (!) 134/95   Pulse (!) 58   Temp 98.3 F (36.8 C) (Oral)   Resp 16   Ht 6\' 2"  (1.88 m)   Wt 104.3 kg   SpO2 100%   BMI 29.53 kg/m   Physical Exam Vitals and nursing note reviewed.  Constitutional:      General: He is not in acute distress.    Appearance: Normal appearance. He is not ill-appearing, toxic-appearing or diaphoretic.  HENT:     Head: Normocephalic and atraumatic.     Right Ear: External ear normal.     Left Ear: External  ear normal.     Nose: Nose normal.     Mouth/Throat:     Mouth: Mucous membranes are moist.     Pharynx: Oropharynx is clear. No oropharyngeal exudate or posterior oropharyngeal erythema.  Eyes:     Extraocular Movements: Extraocular movements intact.  Cardiovascular:     Rate and Rhythm: Normal rate and regular rhythm.     Pulses: Normal pulses.     Heart sounds: Normal heart sounds. No murmur heard.  No friction rub. No gallop.   Pulmonary:     Effort: Pulmonary effort is normal. No respiratory distress.     Breath sounds: Normal breath sounds. No stridor. No wheezing, rhonchi or rales.  Abdominal:     General: Abdomen is flat.     Tenderness: There is generalized abdominal tenderness.  Genitourinary:    Comments: Nursing chaperone present.  No visible or palpable hemorrhoids noted. Small amount of brown stool noted in the rectal vault. No gross hematochezia. No pain appreciated throughout the exam. Musculoskeletal:        General: Normal range of motion.     Cervical back: Normal range of motion and neck supple. No tenderness.  Skin:    General: Skin is warm and dry.  Neurological:     General: No focal deficit present.     Mental Status: He is alert and oriented to person, place, and time.  Psychiatric:        Mood and Affect: Mood normal.        Behavior: Behavior normal.    ED Results / Procedures / Treatments   Labs (all labs ordered are listed, but only abnormal results are displayed) Labs Reviewed  CBC WITH DIFFERENTIAL/PLATELET  COMPREHENSIVE METABOLIC PANEL  LIPASE, BLOOD  OCCULT BLOOD X 1 CARD TO LAB, STOOL   EKG None  Radiology CT ABDOMEN PELVIS W CONTRAST  Result Date: 03/18/2020 CLINICAL DATA:  Diffuse abdominal pain EXAM: CT ABDOMEN AND PELVIS WITH CONTRAST TECHNIQUE: Multidetector CT imaging of the abdomen and pelvis was performed using the standard protocol following bolus administration of intravenous contrast. CONTRAST:  OMNIPAQUE IOHEXOL 300 MG/ML  SOLN COMPARISON:  March 22, 2018 FINDINGS: Lower chest: The visualized heart size within normal limits. No pericardial fluid/thickening. No hiatal hernia. The visualized portions of the lungs are clear. Hepatobiliary: There is diffuse low density seen throughout the liver parenchyma. No focal hepatic lesion is seen. No intra or extrahepatic biliary ductal dilatation.The main portal vein is patent. No evidence of calcified gallstones, gallbladder wall thickening or biliary dilatation. Pancreas: Unremarkable. No pancreatic ductal dilatation or surrounding inflammatory changes. Spleen: Normal in size without focal abnormality. Adrenals/Urinary Tract: Both adrenal glands appear normal. Again noted is a stable large low-density lesion within the left kidney  measuring 9 1 x 8.6 cm. There is peripheral calcifications present. No hydronephrosis. Bladder is unremarkable. Stomach/Bowel: The stomach, small bowel, and colon are normal in appearance. No inflammatory changes, wall thickening, or obstructive findings. Scattered diverticula are noted.There is question of minimal fat stranding changes seen around the mid descending colon. No loculated fluid collections or free air. The appendix is unremarkable. Vascular/Lymphatic: There are no enlarged mesenteric, retroperitoneal, or pelvic lymph nodes. No significant vascular findings are present. Reproductive: The prostate is unremarkable. Other: No evidence of abdominal wall mass or hernia. Musculoskeletal: No acute or significant osseous findings. IMPRESSION: Findings suggestive mid descending colon mild diverticulitis. No surrounding loculated fluid collections or free air. Hepatic steatosis Electronically Signed   By: Jonna Clark  M.D.   On: 03/18/2020 18:27    Procedures Procedures   Medications Ordered in ED Medications  amoxicillin-clavulanate (AUGMENTIN) 875-125 MG per tablet 1 tablet (has no administration in time range)  iohexol (OMNIPAQUE) 300 MG/ML solution 100 mL (100 mLs Intravenous Contrast Given 03/18/20 1757)    ED Course  I have reviewed the triage vital signs and the nursing notes.  Pertinent labs & imaging results that were available during my care of the patient were reviewed by me and considered in my medical decision making (see chart for details).  Clinical Course as of Mar 18 1924  Fri Mar 18, 2020  1746 Fecal Occult Blood, POC: NEGATIVE [LJ]  1838 Findings suggestive mid descending colon mild diverticulitis. No surrounding loculated fluid collections or free air.  Hepatic steatosis   [LJ]    Clinical Course User Index [LJ] Placido Sou, PA-C   MDM Rules/Calculators/A&P                          Patient is a 49 year old male with a history of diverticulitis that  presents to the emergency department with diffuse abdominal pain that started last night as well as multiple bouts of hematochezia.  Patient is a Designer, jewellery.  Basic labs obtained in triage were reassuring.  CBC, CMP, lipase were all benign.  Fecal occult test is negative.  Hemoglobin at 16.5.  Stable.  No gross hematochezia on my exam.  Given patient's symptoms as well as his medical history I recommended a CT scan and the patient was amenable.  This was significant for mid descending colon mild diverticulitis without any surrounding loculated fluid collections or free air.  These findings were discussed with the patient at length.  We will start him on Augmentin.  He was given his first dose in the emergency department.  Patient is eager to be discharged and I feel that he is stable for discharge at this time.  He is afebrile, not tachycardic, normotensive, not hypoxic.  He is planning on following up with his primary doctor regarding his symptoms.  He is planning on working with his primary doctor to find a gastroenterologist in his area as well.  He understands to return to the ED with new or worsening symptoms.  His questions were answered and he was amicable at the time of discharge.  Final Clinical Impression(s) / ED Diagnoses Final diagnoses:  Diverticulitis    Rx / DC Orders ED Discharge Orders         Ordered    amoxicillin-clavulanate (AUGMENTIN) 875-125 MG tablet  3 times daily        03/18/20 1926           Placido Sou, PA-C 03/18/20 1933    Linwood Dibbles, MD 03/19/20 [phone]

## 2020-03-18 NOTE — ED Notes (Signed)
Assumed care. Pt in CT scan.

## 2020-03-18 NOTE — ED Notes (Signed)
ED Provider at bedside. 

## 2020-03-18 NOTE — ED Triage Notes (Signed)
C/o diffuse abd pain x 1 day with rectal bleeding x 4 episodes

## 2020-03-18 NOTE — Discharge Instructions (Addendum)
Like we discussed, I am prescribing you an antibiotic called Augmentin.  Please take this 3 times a day for the next 10 days.  I would recommend you follow-up with your primary doctor, ideally before Thanksgiving if they have the availability.  I would work with your primary doctor to find a gastroenterologist in the area so that you can follow-up with them in the future as well.  If you develop any new or worsening symptoms please return to the emergency department for reevaluation.  It was a pleasure to meet you.

## 2020-10-31 ENCOUNTER — Other Ambulatory Visit: Payer: Self-pay

## 2020-10-31 ENCOUNTER — Emergency Department (HOSPITAL_BASED_OUTPATIENT_CLINIC_OR_DEPARTMENT_OTHER)
Admission: EM | Admit: 2020-10-31 | Discharge: 2020-10-31 | Disposition: A | Discharge status: Home / Self Care | Payer: 59 | Attending: Emergency Medicine | Admitting: Emergency Medicine

## 2020-10-31 ENCOUNTER — Emergency Department (HOSPITAL_BASED_OUTPATIENT_CLINIC_OR_DEPARTMENT_OTHER): Payer: 59

## 2020-10-31 ENCOUNTER — Encounter (HOSPITAL_BASED_OUTPATIENT_CLINIC_OR_DEPARTMENT_OTHER): Payer: Self-pay | Admitting: *Deleted

## 2020-10-31 DIAGNOSIS — N132 Hydronephrosis with renal and ureteral calculous obstruction: Secondary | ICD-10-CM | POA: Insufficient documentation

## 2020-10-31 DIAGNOSIS — R109 Unspecified abdominal pain: Secondary | ICD-10-CM

## 2020-10-31 DIAGNOSIS — N201 Calculus of ureter: Secondary | ICD-10-CM

## 2020-10-31 DIAGNOSIS — R1032 Left lower quadrant pain: Secondary | ICD-10-CM | POA: Diagnosis present

## 2020-10-31 DIAGNOSIS — D72829 Elevated white blood cell count, unspecified: Secondary | ICD-10-CM | POA: Insufficient documentation

## 2020-10-31 LAB — CBC WITH DIFFERENTIAL/PLATELET
Abs Immature Granulocytes: 0.04 10*3/uL (ref 0.00–0.07)
Basophils Absolute: 0 10*3/uL (ref 0.0–0.1)
Basophils Relative: 0 %
Eosinophils Absolute: 0.1 10*3/uL (ref 0.0–0.5)
Eosinophils Relative: 1 %
HCT: 45.7 % (ref 39.0–52.0)
Hemoglobin: 15.5 g/dL (ref 13.0–17.0)
Immature Granulocytes: 0 %
Lymphocytes Relative: 16 %
Lymphs Abs: 1.7 10*3/uL (ref 0.7–4.0)
MCH: 30.8 pg (ref 26.0–34.0)
MCHC: 33.9 g/dL (ref 30.0–36.0)
MCV: 90.9 fL (ref 80.0–100.0)
Monocytes Absolute: 1.2 10*3/uL — ABNORMAL HIGH (ref 0.1–1.0)
Monocytes Relative: 11 %
Neutro Abs: 7.7 10*3/uL (ref 1.7–7.7)
Neutrophils Relative %: 72 %
Platelets: 409 10*3/uL — ABNORMAL HIGH (ref 150–400)
RBC: 5.03 MIL/uL (ref 4.22–5.81)
RDW: 11.9 % (ref 11.5–15.5)
WBC: 10.8 10*3/uL — ABNORMAL HIGH (ref 4.0–10.5)
nRBC: 0 % (ref 0.0–0.2)

## 2020-10-31 LAB — COMPREHENSIVE METABOLIC PANEL
ALT: 23 U/L (ref 0–44)
AST: 24 U/L (ref 15–41)
Albumin: 3.8 g/dL (ref 3.5–5.0)
Alkaline Phosphatase: 55 U/L (ref 38–126)
Anion gap: 8 (ref 5–15)
BUN: 13 mg/dL (ref 6–20)
CO2: 25 mmol/L (ref 22–32)
Calcium: 8.7 mg/dL — ABNORMAL LOW (ref 8.9–10.3)
Chloride: 103 mmol/L (ref 98–111)
Creatinine, Ser: 1.38 mg/dL — ABNORMAL HIGH (ref 0.61–1.24)
GFR, Estimated: >60 mL/min (ref >60)
Glucose, Bld: 96 mg/dL (ref 70–99)
Potassium: 4.4 mmol/L (ref 3.5–5.1)
Sodium: 136 mmol/L (ref 135–145)
Total Bilirubin: 0.5 mg/dL (ref 0.3–1.2)
Total Protein: 6.6 g/dL (ref 6.5–8.1)

## 2020-10-31 LAB — URINALYSIS, ROUTINE W REFLEX MICROSCOPIC
Bilirubin Urine: NEGATIVE
Glucose, UA: NEGATIVE mg/dL
Ketones, ur: NEGATIVE mg/dL
Leukocytes,Ua: NEGATIVE
Nitrite: NEGATIVE
Protein, ur: NEGATIVE mg/dL
Specific Gravity, Urine: 1.01 (ref 1.005–1.030)
pH: 6.5 (ref 5.0–8.0)

## 2020-10-31 LAB — URINALYSIS, MICROSCOPIC (REFLEX)

## 2020-10-31 LAB — LIPASE, BLOOD: Lipase: 35 U/L (ref 11–51)

## 2020-10-31 MED ORDER — ONDANSETRON HCL 4 MG PO TABS: 4.0000 mg | ORAL_TABLET | Freq: Four times a day (QID) | ORAL | 0 refills | Status: DC

## 2020-10-31 MED ORDER — OXYCODONE-ACETAMINOPHEN 5-325 MG PO TABS
1.0000 | ORAL_TABLET | Freq: Three times a day (TID) | ORAL | 0 refills | Status: AC | PRN
Start: 2020-10-31 — End: 2020-11-04

## 2020-10-31 MED ORDER — TAMSULOSIN HCL 0.4 MG PO CAPS: 0.4000 mg | ORAL_CAPSULE | Freq: Every day | ORAL | 0 refills | Status: AC

## 2020-10-31 MED ORDER — KETOROLAC TROMETHAMINE 15 MG/ML IJ SOLN
15.0000 mg | Freq: Once | INTRAMUSCULAR | Status: AC
  Administered 2020-10-31: 17:00:00 15 mg via INTRAMUSCULAR
  Filled 2020-10-31: qty 1

## 2020-10-31 NOTE — Discharge Instructions (Addendum)
I suspect your pain is from a kidney stone, it is at the point where it should pass into your bladder.  I have given you a prescription for Flomax please take as prescribed, please  be aware this medication can lower your blood pressure be careful when you go from a sitting to standing position, if you start to feel lightheaded  or dizziness please discontinue this medication.  Please stay hydrated.  Also given you medication for pain this medication can make you drowsy do not consume alcohol or operate heavy machinery when taking this medication.  This medication has Tylenol in it do not take Tylenol when take this medication.  Please follow-up with urology for further evaluation.  You must come back to emerge apartment if you develop fevers, chills, severe abdominal pain, uncontrolled nausea, vomiting, difficulty urinating or unable to urinate as these symptoms are concerning for infected stone.

## 2020-10-31 NOTE — ED Provider Notes (Signed)
[facility] EMERGENCY DEPARTMENT Provider Note   CSN: [id number] Arrival date & time: 10/31/20  1528     History Chief Complaint  Patient presents with   Flank Pain    Eric Ayers is a 50 y.o. male.  HPI  Patient with significant medical history of diverticulitis kidney stones presents with chief complaint of left-sided flank pain.  Patient states flank pain started this morning, woke him out of his sleep, states it is a throbbing sensation in his left side, he is also feeling some pain in his left lower quadrant, states he has had  this left lower quadrant pain for last 3 days, states he thinks is a flareup of his diverticulitis.  He has some episodes of nausea but denies vomiting, he denies constipation, diarrhea, denies hematochezia, melena, he denies urinary urgency, frequency, does endorse some decreased urination.  He denies alleviating factors.  Patient endorsed fevers, chills, chest pain or shortness of breath.  Past Medical History:  Diagnosis Date   Diverticulitis    Kidney stone     Patient Active Problem List   Diagnosis Date Noted   KNEE PAIN, LEFT 12/22/2009   MEDIAL MENISCUS TEAR, LEFT 12/22/2009    Past Surgical History:  Procedure Laterality Date   WRIST ARTHROSCOPY         No family history on file.  Social History   Tobacco Use   Smoking status: Never   Smokeless tobacco: Never  Substance Use Topics   Alcohol use: No   Drug use: No    Home Medications Prior to Admission medications   Medication Sig Start Date End Date Taking? Authorizing Provider  ondansetron (ZOFRAN) 4 MG tablet Take 1 tablet (4 mg total) by mouth every 6 (six) hours. 10/31/20  Yes Carroll Sage, PA-C  oxyCODONE-acetaminophen (PERCOCET/ROXICET) 5-325 MG tablet Take 1 tablet by mouth every 8 (eight) hours as needed for up to 4 days for severe pain. 10/31/20 11/04/20 Yes Carroll Sage, PA-C  tamsulosin (FLOMAX) 0.4 MG CAPS capsule Take 1 capsule (0.4 mg total)  by mouth daily for 7 days. 10/31/20 11/07/20 Yes Carroll Sage, PA-C  ciprofloxacin (CIPRO) 500 MG tablet Take 1 tablet (500 mg total) by mouth 2 (two) times daily. One po bid x 7 days 08/30/15   Doug Sou, MD  HYDROcodone-acetaminophen (NORCO) 5-325 MG tablet Take 1-2 tablets by mouth every 6 (six) hours as needed for moderate pain or severe pain. 08/30/15   Doug Sou, MD  metroNIDAZOLE (FLAGYL) 500 MG tablet Take 1 tablet (500 mg total) by mouth 2 (two) times daily. One po bid x 7 days 08/30/15   Doug Sou, MD  zolpidem (AMBIEN) 10 MG tablet Take 10 mg by mouth at bedtime as needed for sleep.    [provider]    Allergies    Patient has no known allergies.  Review of Systems   Review of Systems  Constitutional:  Negative for chills and fever.  HENT:  Negative for congestion.   Respiratory:  Negative for shortness of breath.   Cardiovascular:  Negative for chest pain.  Gastrointestinal:  Positive for abdominal pain and nausea. Negative for constipation, diarrhea and vomiting.  Genitourinary:  Positive for difficulty urinating and flank pain. Negative for enuresis.  Musculoskeletal:  Negative for back pain.  Skin:  Negative for rash.  Neurological:  Negative for headaches.  Hematological:  Does not bruise/bleed easily.   Physical Exam Updated Vital Signs BP 112/85   Pulse 71  Temp 98.8 F (37.1 C) (Oral)   Resp 20   Ht 6\' 2"  (1.88 m)   Wt 104.3 kg   SpO2 99%   BMI 29.53 kg/m   Physical Exam Vitals and nursing note reviewed.  Constitutional:      General: He is not in acute distress.    Appearance: He is not ill-appearing.  HENT:     Head: Normocephalic and atraumatic.     Nose: No congestion.  Eyes:     Conjunctiva/sclera: Conjunctivae normal.  Cardiovascular:     Rate and Rhythm: Normal rate and regular rhythm.     Pulses: Normal pulses.     Heart sounds: No murmur heard.   No friction rub. No gallop.  Pulmonary:     Effort: No  respiratory distress.     Breath sounds: No wheezing, rhonchi or rales.  Abdominal:     General: There is no distension.     Palpations: Abdomen is soft.     Tenderness: There is abdominal tenderness. There is left CVA tenderness. There is no right CVA tenderness.     Comments: Patient's abdomen was visualized is nondistended, normal bowel sounds, dull to percussion, he had tenderness in his left lower quadrant, as well as left-sided flank pain, there is no guarding, rebound tenderness, peritoneal sign, negative McBurney point.   Positive CVA tenderness on the right side.  Skin:    General: Skin is warm and dry.  Neurological:     Mental Status: He is alert.  Psychiatric:        Mood and Affect: Mood normal.    ED Results / Procedures / Treatments   Labs (all labs ordered are listed, but only abnormal results are displayed) Labs Reviewed  CBC WITH DIFFERENTIAL/PLATELET - Abnormal; Notable for the following components:      Result Value   WBC 10.8 (*)    Platelets 409 (*)    Monocytes Absolute 1.2 (*)    All other components within normal limits  COMPREHENSIVE METABOLIC PANEL - Abnormal; Notable for the following components:   Creatinine, Ser 1.38 (*)    Calcium 8.7 (*)    All other components within normal limits  URINALYSIS, ROUTINE W REFLEX MICROSCOPIC - Abnormal; Notable for the following components:   Hgb urine dipstick TRACE (*)    All other components within normal limits  URINALYSIS, MICROSCOPIC (REFLEX) - Abnormal; Notable for the following components:   Bacteria, UA RARE (*)    All other components within normal limits  LIPASE, BLOOD    EKG None  Radiology CT Renal Stone Study  Result Date: 10/31/2020 CLINICAL DATA:  Left flank pain beginning last night. Nephrolithiasis. EXAM: CT ABDOMEN AND PELVIS WITHOUT CONTRAST TECHNIQUE: Multidetector CT imaging of the abdomen and pelvis was performed following the standard protocol without IV contrast. COMPARISON:  03/18/2020  FINDINGS: Lower chest: No acute findings. A few tiny sub-cm pulmonary nodules in both lung bases are stable. Hepatobiliary: No mass visualized on this unenhanced exam. Mild diffuse hepatic steatosis is noted. Gallbladder is unremarkable. No evidence of biliary ductal dilatation. Pancreas: No mass or inflammatory process visualized on this unenhanced exam. Spleen:  Within normal limits in size. Adrenals/Urinary tract: 2 mm nonobstructing right renal calculus noted. A large left renal sinus cyst is again seen. New mild left hydronephrosis is seen due to an 8 mm calculus at the left UPJ. Stomach/Bowel: No evidence of obstruction, inflammatory process, or abnormal fluid collections. Normal appendix visualized. Diverticulosis is seen mainly involving the  descending and sigmoid colon, however there is no evidence of diverticulitis. Vascular/Lymphatic: No pathologically enlarged lymph nodes identified. No evidence of abdominal aortic aneurysm. Reproductive:  No mass or other significant abnormality. Other:  None. Musculoskeletal:  No suspicious bone lesions identified. IMPRESSION: New mild left hydronephrosis due to 8 mm calculus at the left UPJ. Tiny nonobstructing right renal calculus. Stable large left renal sinus cyst. Colonic diverticulosis. No radiographic evidence of diverticulitis. Mild hepatic steatosis. Electronically Signed   By: Danae Orleans M.D.   On: 10/31/2020 16:15    Procedures Procedures   Medications Ordered in ED Medications  ketorolac (TORADOL) 15 MG/ML injection 15 mg (15 mg Intramuscular Given 10/31/20 1649)    ED Course  I have reviewed the triage vital signs and the nursing notes.  Pertinent labs & imaging results that were available during my care of the patient were reviewed by me and considered in my medical decision making (see chart for details).    MDM Rules/Calculators/A&P                         Initial impression-patient presents with left-sided flank pain right lower  abdominal pain.  He is alert, does not appear in acute stress, vital signs reassuring.  Triage obtained lab work and imaging.  We will continue to monitor.  Work-up-CBC shows slight leukocytosis of 10.8, CMP shows slight increased creatinine of 1.3, UA negative for nitrates, leukocytes, hematuria, does show rare bacteria.  Lipase 35.  CT imaging reveals new left hydronephrosis due to an 8 mm stone at the left UPJ, tiny nonobstructing right renal calculus, diverticulosis without diverticulitis.  Reassessment-patient was updated on lab work and imaging, states he is having some slight pain we will provide him with a dose of Toradol here, patient is in agreement with plan, he is agreed for discharge  Rule out-low suspicion for systemic infection as patient is nontoxic-appearing, vital signs reassuring, no obvious source infection present on exam, he does have slight leukocytosis of 10.8 but I suspect this more acute phase reactant  secondary due to the 8 mm stone.  I have low suspicion for diverticulitis as there is no inflammatory changes seen on the CT abdomen pelvis, I suspect left lower quadrant pain is more likely due to the stone at the UVJ point.  Low suspicion for bowel obstruction as abdomen is nondistended dull to percussion patient still having bowel movements and passing flatus.  low suspicion for a septic stone as UA is unremarkable for nitrates or leukocytes.  Plan-  Left-sided abdominal flank pain-suspect secondary due to a kidney stone, suspect this will pass has a stone at the UVJ point, we will have him follow-up with urology for further evaluation.  Given strict return precautions.  Vital signs have remained stable, no indication for hospital admission.  Patient given at home care as well strict return precautions.  Patient verbalized that they understood agreed to said plan.  Final Clinical Impression(s) / ED Diagnoses Final diagnoses:  Flank pain  Ureterolithiasis    Rx / DC  Orders ED Discharge Orders          Ordered    oxyCODONE-acetaminophen (PERCOCET/ROXICET) 5-325 MG tablet  Every 8 hours PRN        10/31/20 1659    ondansetron (ZOFRAN) 4 MG tablet  Every 6 hours        10/31/20 1659    tamsulosin (FLOMAX) 0.4 MG CAPS capsule  Daily  10/31/20 1659             Carroll Sage, PA-C 10/31/20 1702    Virgina Norfolk, DO 10/31/20 1714

## 2020-10-31 NOTE — ED Triage Notes (Signed)
Pt reports his usual kidney stone pain to his left flank x last night. States pain was 9/10 this am, now 7/10.

## 2020-11-10 ENCOUNTER — Encounter (HOSPITAL_BASED_OUTPATIENT_CLINIC_OR_DEPARTMENT_OTHER): Payer: Self-pay | Admitting: Urology

## 2020-11-10 ENCOUNTER — Other Ambulatory Visit: Payer: Self-pay | Admitting: Urology

## 2020-11-10 DIAGNOSIS — K5790 Diverticulosis of intestine, part unspecified, without perforation or abscess without bleeding: Secondary | ICD-10-CM

## 2020-11-10 DIAGNOSIS — G4700 Insomnia, unspecified: Secondary | ICD-10-CM

## 2020-11-10 DIAGNOSIS — F419 Anxiety disorder, unspecified: Secondary | ICD-10-CM

## 2020-11-10 DIAGNOSIS — Z87442 Personal history of urinary calculi: Secondary | ICD-10-CM

## 2020-11-10 DIAGNOSIS — R002 Palpitations: Secondary | ICD-10-CM

## 2020-11-10 HISTORY — DX: Palpitations: R00.2

## 2020-11-10 HISTORY — DX: Personal history of urinary calculi: Z87.442

## 2020-11-10 HISTORY — DX: Anxiety disorder, unspecified: F41.9

## 2020-11-10 HISTORY — DX: Diverticulosis of intestine, part unspecified, without perforation or abscess without bleeding: K57.90

## 2020-11-10 HISTORY — DX: Insomnia, unspecified: G47.00

## 2020-11-10 NOTE — Anesthesia Preprocedure Evaluation (Addendum)
Anesthesia Evaluation  Patient identified by MRN, date of birth, ID band Patient awake    Reviewed: Allergy & Precautions, NPO status , Patient's Chart, lab work & pertinent test results  History of Anesthesia Complications Negative for: history of anesthetic complications  Airway Mallampati: II  TM Distance: >3 FB Neck ROM: Full  Mouth opening: Limited Mouth Opening  Dental no notable dental hx.    Pulmonary Current Smoker,    Pulmonary exam normal        Cardiovascular negative cardio ROS Normal cardiovascular exam     Neuro/Psych Anxiety    GI/Hepatic Neg liver ROS, GERD  Medicated and Controlled,  Endo/Other  negative endocrine ROS  Renal/GU Left renal stone  negative genitourinary   Musculoskeletal negative musculoskeletal ROS (+)   Abdominal   Peds  Hematology negative hematology ROS (+)   Anesthesia Other Findings Day of surgery medications reviewed with patient.  Reproductive/Obstetrics negative OB ROS                            Anesthesia Physical Anesthesia Plan  ASA: 2  Anesthesia Plan: General   Post-op Pain Management:    Induction: Intravenous  PONV Risk Score and Plan: 2 and Treatment may vary due to age or medical condition, Ondansetron, Dexamethasone and Midazolam  Airway Management Planned: LMA  Additional Equipment: None  Intra-op Plan:   Post-operative Plan: Extubation in OR  Informed Consent: I have reviewed the patients History and Physical, chart, labs and discussed the procedure including the risks, benefits and alternatives for the proposed anesthesia with the patient or authorized representative who has indicated his/her understanding and acceptance.     Dental advisory given  Plan Discussed with: CRNA  Anesthesia Plan Comments:        Anesthesia Quick Evaluation

## 2020-11-10 NOTE — Progress Notes (Signed)
Spoke w/ via phone for pre-op interview---pt Lab needs dos----   hx of palpitaions ask mda if ekg and istat needed order entered in epic if needed per mda            Lab results------holter monitor report 02-12-2019 [facility] COVID test -----patient states asymptomatic no test needed Arrive at -------700 am 11-11-2020 NPO after MN NO Solid Food.  Clear liquids from MN until---600 am then npo Med rec completed Medications to take morning of surgery -----none Diabetic medication -----n/a Patient instructed no nail polish to be worn day of surgery n/a Patient instructed to bring photo id and insurance card day of surgery Patient aware to have Driver (ride ) / caregiver  wife leslie godrey will stay   for 24 hours after surgery  Patient Special Instructions -----none Pre-Op special Istructions -----none Patient verbalized understanding of instructions that were given at this phone interview. Patient denies shortness of breath, chest pain, fever, cough at this phone interview.

## 2020-11-11 ENCOUNTER — Encounter (HOSPITAL_BASED_OUTPATIENT_CLINIC_OR_DEPARTMENT_OTHER): Payer: Self-pay | Admitting: Urology

## 2020-11-11 ENCOUNTER — Ambulatory Visit (HOSPITAL_BASED_OUTPATIENT_CLINIC_OR_DEPARTMENT_OTHER)
Admission: RE | Admit: 2020-11-11 | Discharge: 2020-11-11 | Disposition: A | Discharge status: Home / Self Care | Payer: 59 | Source: Ambulatory Visit | Attending: Urology | Admitting: Urology

## 2020-11-11 ENCOUNTER — Other Ambulatory Visit: Payer: Self-pay

## 2020-11-11 ENCOUNTER — Encounter (HOSPITAL_BASED_OUTPATIENT_CLINIC_OR_DEPARTMENT_OTHER)
Admission: RE | Disposition: A | Discharge status: Home / Self Care | Payer: Self-pay | Source: Ambulatory Visit | Attending: Urology

## 2020-11-11 ENCOUNTER — Ambulatory Visit (HOSPITAL_BASED_OUTPATIENT_CLINIC_OR_DEPARTMENT_OTHER): Payer: 59 | Admitting: Anesthesiology

## 2020-11-11 DIAGNOSIS — N132 Hydronephrosis with renal and ureteral calculous obstruction: Secondary | ICD-10-CM | POA: Diagnosis not present

## 2020-11-11 DIAGNOSIS — Z841 Family history of disorders of kidney and ureter: Secondary | ICD-10-CM | POA: Diagnosis not present

## 2020-11-11 DIAGNOSIS — F17200 Nicotine dependence, unspecified, uncomplicated: Secondary | ICD-10-CM | POA: Diagnosis not present

## 2020-11-11 DIAGNOSIS — N400 Enlarged prostate without lower urinary tract symptoms: Secondary | ICD-10-CM | POA: Insufficient documentation

## 2020-11-11 DIAGNOSIS — Z885 Allergy status to narcotic agent status: Secondary | ICD-10-CM | POA: Diagnosis not present

## 2020-11-11 HISTORY — PX: CYSTOSCOPY/URETEROSCOPY/HOLMIUM LASER/STENT PLACEMENT: SHX6546

## 2020-11-11 LAB — POCT I-STAT, CHEM 8
BUN: 19 mg/dL (ref 6–20)
Calcium, Ion: 1.18 mmol/L (ref 1.15–1.40)
Chloride: 105 mmol/L (ref 98–111)
Creatinine, Ser: 0.9 mg/dL (ref 0.61–1.24)
Glucose, Bld: 106 mg/dL — ABNORMAL HIGH (ref 70–99)
HCT: 45 % (ref 39.0–52.0)
Hemoglobin: 15.3 g/dL (ref 13.0–17.0)
Potassium: 4 mmol/L (ref 3.5–5.1)
Sodium: 141 mmol/L (ref 135–145)
TCO2: 24 mmol/L (ref 22–32)

## 2020-11-11 SURGERY — CYSTOSCOPY/URETEROSCOPY/HOLMIUM LASER/STENT PLACEMENT
Anesthesia: General | Site: Ureter | Laterality: Left

## 2020-11-11 MED ORDER — OXYCODONE HCL 5 MG/5ML PO SOLN: 5.0000 mg | Freq: Once | ORAL | Status: AC | PRN

## 2020-11-11 MED ORDER — SODIUM CHLORIDE 0.9 % IV SOLN
INTRAVENOUS | Status: AC
  Filled 2020-11-11: qty 2

## 2020-11-11 MED ORDER — ACETAMINOPHEN 500 MG PO TABS
1000.0000 mg | ORAL_TABLET | Freq: Once | ORAL | Status: AC
  Administered 2020-11-11: 1000 mg via ORAL

## 2020-11-11 MED ORDER — DEXAMETHASONE SODIUM PHOSPHATE 10 MG/ML IJ SOLN
INTRAMUSCULAR | Status: DC | PRN
  Administered 2020-11-11: 8 mg via INTRAVENOUS

## 2020-11-11 MED ORDER — KETOROLAC TROMETHAMINE 30 MG/ML IJ SOLN
INTRAMUSCULAR | Status: DC | PRN
  Administered 2020-11-11: 30 mg via INTRAVENOUS

## 2020-11-11 MED ORDER — KETOROLAC TROMETHAMINE 30 MG/ML IJ SOLN
INTRAMUSCULAR | Status: AC
  Filled 2020-11-11: qty 1

## 2020-11-11 MED ORDER — ONDANSETRON HCL 4 MG/2ML IJ SOLN
INTRAMUSCULAR | Status: DC | PRN
  Administered 2020-11-11: 4 mg via INTRAVENOUS

## 2020-11-11 MED ORDER — FENTANYL CITRATE (PF) 100 MCG/2ML IJ SOLN: 25.0000 ug | INTRAMUSCULAR | Status: DC | PRN

## 2020-11-11 MED ORDER — PROPOFOL 10 MG/ML IV BOLUS
INTRAVENOUS | Status: AC
  Filled 2020-11-11: qty 20

## 2020-11-11 MED ORDER — FENTANYL CITRATE (PF) 100 MCG/2ML IJ SOLN
INTRAMUSCULAR | Status: DC | PRN
  Administered 2020-11-11: 50 ug via INTRAVENOUS
  Administered 2020-11-11: 25 ug via INTRAVENOUS

## 2020-11-11 MED ORDER — OXYCODONE HCL 5 MG PO TABS
5.0000 mg | ORAL_TABLET | Freq: Once | ORAL | Status: AC | PRN
Start: 2020-11-11 — End: 2020-11-11
  Administered 2020-11-11: 5 mg via ORAL

## 2020-11-11 MED ORDER — MIDAZOLAM HCL 5 MG/5ML IJ SOLN
INTRAMUSCULAR | Status: DC | PRN
  Administered 2020-11-11: 2 mg via INTRAVENOUS

## 2020-11-11 MED ORDER — MIDAZOLAM HCL 2 MG/2ML IJ SOLN
INTRAMUSCULAR | Status: AC
  Filled 2020-11-11: qty 2

## 2020-11-11 MED ORDER — ONDANSETRON HCL 4 MG/2ML IJ SOLN
INTRAMUSCULAR | Status: AC
  Filled 2020-11-11: qty 2

## 2020-11-11 MED ORDER — SODIUM CHLORIDE 0.9 % IR SOLN
Status: DC | PRN
  Administered 2020-11-11: 1500 mL

## 2020-11-11 MED ORDER — ACETAMINOPHEN 500 MG PO TABS
ORAL_TABLET | ORAL | Status: AC
  Filled 2020-11-11: qty 2

## 2020-11-11 MED ORDER — PROPOFOL 10 MG/ML IV BOLUS
INTRAVENOUS | Status: DC | PRN
  Administered 2020-11-11: 30 mg via INTRAVENOUS
  Administered 2020-11-11: 200 mg via INTRAVENOUS

## 2020-11-11 MED ORDER — 0.9 % SODIUM CHLORIDE (POUR BTL) OPTIME
TOPICAL | Status: DC | PRN
  Administered 2020-11-11: 500 mL

## 2020-11-11 MED ORDER — FENTANYL CITRATE (PF) 100 MCG/2ML IJ SOLN
INTRAMUSCULAR | Status: AC
  Filled 2020-11-11: qty 2

## 2020-11-11 MED ORDER — LACTATED RINGERS IV SOLN
INTRAVENOUS | Status: DC
  Administered 2020-11-11: 14000 mL via INTRAVENOUS

## 2020-11-11 MED ORDER — PROMETHAZINE HCL 25 MG/ML IJ SOLN: 6.2500 mg | INTRAMUSCULAR | Status: DC | PRN

## 2020-11-11 MED ORDER — IOHEXOL 300 MG/ML  SOLN
INTRAMUSCULAR | Status: DC | PRN
  Administered 2020-11-11: 10 mL via URETHRAL

## 2020-11-11 MED ORDER — SODIUM CHLORIDE 0.9 % IV SOLN
2.0000 g | INTRAVENOUS | Status: AC
  Administered 2020-11-11: 2 g via INTRAVENOUS

## 2020-11-11 MED ORDER — LIDOCAINE 2% (20 MG/ML) 5 ML SYRINGE
INTRAMUSCULAR | Status: DC | PRN
  Administered 2020-11-11: 100 mg via INTRAVENOUS

## 2020-11-11 MED ORDER — OXYCODONE HCL 5 MG PO TABS
ORAL_TABLET | ORAL | Status: AC
  Filled 2020-11-11: qty 1

## 2020-11-11 SURGICAL SUPPLY — 19 items
BAG DRAIN URO-CYSTO SKYTR STRL (DRAIN) ×2 IMPLANT
BAG DRN UROCATH (DRAIN) ×1
CATH INTERMIT  6FR 70CM (CATHETERS) IMPLANT
CLOTH BEACON ORANGE TIMEOUT ST (SAFETY) ×2 IMPLANT
FIBER LASER FLEXIVA 365 (UROLOGICAL SUPPLIES) IMPLANT
GLOVE SURG ENC MOIS LTX SZ7.5 (GLOVE) ×2 IMPLANT
GOWN STRL REUS W/TWL XL LVL3 (GOWN DISPOSABLE) ×2 IMPLANT
GUIDEWIRE STR DUAL SENSOR (WIRE) ×4 IMPLANT
IV NS IRRIG 3000ML ARTHROMATIC (IV SOLUTION) ×2 IMPLANT
KIT TURNOVER CYSTO (KITS) ×2 IMPLANT
MANIFOLD NEPTUNE II (INSTRUMENTS) ×2 IMPLANT
NS IRRIG 500ML POUR BTL (IV SOLUTION) ×2 IMPLANT
PACK CYSTO (CUSTOM PROCEDURE TRAY) ×2 IMPLANT
SHEATH URET ACCESS 12FR/35CM (UROLOGICAL SUPPLIES) ×2 IMPLANT
STENT URET 6FRX26 CONTOUR (STENTS) ×2 IMPLANT
TRACTIP FLEXIVA PULS ID 200XHI (Laser) ×1 IMPLANT
TRACTIP FLEXIVA PULSE ID 200 (Laser) ×2
TUBE CONNECTING 12X1/4 (SUCTIONS) ×2 IMPLANT
TUBING UROLOGY SET (TUBING) ×2 IMPLANT

## 2020-11-11 NOTE — Interval H&P Note (Signed)
History and Physical Interval Note:  11/11/2020 8:34 AM  Eric Ayers  has presented today for surgery, with the diagnosis of LEFT RENAL STONE.  The various methods of treatment have been discussed with the patient and family. After consideration of risks, benefits and other options for treatment, the patient has consented to  Procedure(s) with comments: CYSTOSCOPY/URETEROSCOPY/HOLMIUM LASER/STENT PLACEMENT (Left) - 1 HR as a surgical intervention.  The patient's history has been reviewed, patient examined, no change in status, stable for surgery.  I have reviewed the patient's chart and labs.  Questions were answered to the patient's satisfaction.     Ray Church, III

## 2020-11-11 NOTE — Anesthesia Procedure Notes (Signed)
Procedure Name: LMA Insertion Date/Time: 11/11/2020 8:53 AM Performed by: Briant Sites, CRNA Pre-anesthesia Checklist: Patient identified, Emergency Drugs available, Suction available and Patient being monitored Patient Re-evaluated:Patient Re-evaluated prior to induction Oxygen Delivery Method: Circle system utilized Preoxygenation: Pre-oxygenation with 100% oxygen Induction Type: IV induction Ventilation: Mask ventilation without difficulty LMA: LMA inserted LMA Size: 5.0 Number of attempts: 1 Airway Equipment and Method: Bite block Placement Confirmation: positive ETCO2 Tube secured with: Tape Dental Injury: Teeth and Oropharynx as per pre-operative assessment

## 2020-11-11 NOTE — Anesthesia Postprocedure Evaluation (Signed)
Anesthesia Post Note  Patient: Eric Ayers  Procedure(s) Performed: CYSTOSCOPY/URETEROSCOPY/HOLMIUM LASER/STENT PLACEMENT (Left: Ureter)     Patient location during evaluation: PACU Anesthesia Type: General Level of consciousness: awake and alert and oriented Pain management: pain level controlled Vital Signs Assessment: post-procedure vital signs reviewed and stable Respiratory status: spontaneous breathing, nonlabored ventilation and respiratory function stable Cardiovascular status: blood pressure returned to baseline Postop Assessment: no apparent nausea or vomiting Anesthetic complications: no   No notable events documented.  Last Vitals:  Vitals:   11/11/20 1000 11/11/20 1040  BP:  110/73  Pulse: 64 62  Resp: 14 16  Temp:  36.6 C  SpO2: 100% 98%    Last Pain:  Vitals:   11/11/20 1040  TempSrc:   PainSc: 3                  Kaylyn Layer

## 2020-11-11 NOTE — Discharge Instructions (Addendum)
[facility] [phone] Post Ureteroscopy With or Without Stent Instructions  Definitions:  Ureter: The duct that transports urine from the kidney to the bladder. Stent:   A plastic hollow tube that is placed into the ureter, from the kidney to the                 bladder to prevent the ureter from swelling shut.  GENERAL INSTRUCTIONS:  Despite the fact that no skin incisions were used, the area around the ureter and bladder is raw and irritated. The stent is a foreign body which will further irritate the bladder wall. This irritation is manifested by increased frequency of urination, both day and night, and by an increase in the urge to urinate. In some, the urge to urinate is present almost always. Sometimes the urge is strong enough that you may not be able to stop yourself from urinating. The only real cure is to remove the stent and then give time for the bladder wall to heal which can't be done until the danger of the ureter swelling shut has passed, which varies.  You may see some blood in your urine while the stent is in place and a few days afterwards. Do not be alarmed, even if the urine was clear for a while. Get off your feet and drink lots of fluids until clearing occurs. If you start to pass clots or don't improve, call us.  DIET: You may return to your normal diet immediately. Because of the raw surface of your bladder, alcohol, spicy foods, acid type foods and drinks with caffeine may cause irritation or frequency and should be used in moderation. To keep your urine flowing freely and to avoid constipation, drink plenty of fluids during the day ( 8-10 glasses ). Tip: Avoid cranberry juice because it is very acidic.  ACTIVITY: Your physical activity doesn't need to be restricted. However, if you are very active, you may see some blood in your urine. We suggest that you reduce your activity under these circumstances until the bleeding has stopped.  BOWELS: It is  important to keep your bowels regular during the postoperative period. Straining with bowel movements can cause bleeding. A bowel movement every other day is reasonable. Use a mild laxative if needed, such as Milk of Magnesia 2-3 tablespoons, or 2 Dulcolax tablets. Call if you continue to have problems. If you have been taking narcotics for pain, before, during or after your surgery, you may be constipated. Take a laxative if necessary.   MEDICATION: You should resume your pre-surgery medications unless told not to. You may take oxybutynin or flomax if prescribed for bladder spasms or discomfort from the stent Take pain medication as directed for pain refractory to conservative management  PROBLEMS YOU SHOULD REPORT TO US: Fevers over 100.5 Fahrenheit. Heavy bleeding, or clots ( See above notes about blood in urine ). Inability to urinate. Drug reactions ( hives, rash, nausea, vomiting, diarrhea ). Severe burning or pain with urination that is not improving.   Post Anesthesia Home Care Instructions  Activity: Get plenty of rest for the remainder of the day. A responsible individual must stay with you for 24 hours following the procedure.  For the next 24 hours, DO NOT: -Drive a car -Operate machinery -Drink alcoholic beverages -Take any medication unless instructed by your physician -Make any legal decisions or sign important papers.  Meals: Start with liquid foods such as gelatin or soup. Progress to regular foods as tolerated. Avoid greasy, spicy,   heavy foods. If nausea and/or vomiting occur, drink only clear liquids until the nausea and/or vomiting subsides. Call your physician if vomiting continues.  Special Instructions/Symptoms: Your throat may feel dry or sore from the anesthesia or the breathing tube placed in your throat during surgery. If this causes discomfort, gargle with warm salt water. The discomfort should disappear within 24 hours.  

## 2020-11-11 NOTE — H&P (Signed)
CC/HPI: CC: Left ureteropelvic junction calculus  HPI:  11/08/2020  50 year old male with a long history nephrolithiasis. He has been followed by Dr. Retta Diones in the past. He has had 2 failed lithotripsies as well as a ureteroscopy. Most recently, he went to the emergency department on 10/31/2020 with left-sided flank pain. He was found to have new mild left hydronephrosis due to a 8 mm calculus at the left ureteropelvic junction. He has some intermittent pain but it is tolerable. He is about out of pain medication. He is out of Flomax. He denies any fever, chill, nausea, vomiting.     ALLERGIES: Morphine Sulfate (Concentrate) SOLN    MEDICATIONS: Tamsulosin Hcl  Ambien TABS Oral  Ondansetron Hcl  Propranolol Hcl     GU PSH: Cysto Uretero Lithotripsy - 2012 Cystoscopy Insert Stent - 2012, 2012 ESWL - 2012, 2012       PSH Notes: Cystoscopy With Ureteroscopy With Lithotripsy, Cystoscopy With Insertion Of Ureteral Stent Left, Lithotripsy, Cystoscopy With Insertion Of Ureteral Stent Left, Lithotripsy, Hand Surgery   NON-GU PSH: Eye Surgery (Unspecified) Shoulder Arthroscopy/surgery, Right Wrist Arthroscopy/surgery, Left     GU PMH: Gross hematuria, Gross hematuria - 2016 Renal cyst, Renal cyst, acquired - 2016 Urinary Tract Inf, Unspec site, Pyuria - 2016 BPH w/o LUTS, Benign prostatic hypertrophy without lower urinary tract symptoms - 2016 Renal calculus, Kidney stone on left side - 2016 History of urolithiasis, Nephrolithiasis - 2014 Other microscopic hematuria, Microscopic hematuria - 2014 Ureteral calculus, Calculus of ureter - 2014      PMH Notes:  2012-03-21 14:22:01 - Note: Nephrolithiasis Of Both Kidneys  2010-05-30 09:53:20 - Note: Arthritis   NON-GU PMH: Encounter for general adult medical examination without abnormal findings, Encounter for preventive health examination - 2016    FAMILY HISTORY: Diabetes - Father Family Health Status - Father alive at age 50 -  Runs In Family Family Health Status - Mother's Age - Runs In Family Family Health Status Number - Runs In Family Gastric Cancer - Mother Hypertension - Father lymphoma - Mother nephrolithiasis - Runs In Family   SOCIAL HISTORY: Marital Status: Married Preferred Language: English; Race: White Current Smoking Status: Patient smokes occasionally.   Tobacco Use Assessment Completed: Used Tobacco in last 30 days? Drinks 3 caffeinated drinks per day.     Notes: Never A Smoker, Caffeine Use, Alcohol Use, Occupation:, Marital History - Currently Married   REVIEW OF SYSTEMS:    GU Review Male:   Patient reports get up at night to urinate. Patient denies frequent urination, hard to postpone urination, burning/ pain with urination, leakage of urine, stream starts and stops, trouble starting your stream, have to strain to urinate , erection problems, and penile pain.  Gastrointestinal (Upper):   Patient denies nausea, vomiting, and indigestion/ heartburn.  Gastrointestinal (Lower):   Patient denies diarrhea and constipation.  Constitutional:   Patient denies fever, night sweats, weight loss, and fatigue.  Skin:   Patient denies skin rash/ lesion and itching.  Eyes:   Patient denies blurred vision and double vision.  Ears/ Nose/ Throat:   Patient denies sore throat and sinus problems.  Hematologic/Lymphatic:   Patient denies swollen glands and easy bruising.  Cardiovascular:   Patient denies leg swelling and chest pains.  Respiratory:   Patient denies cough and shortness of breath.  Endocrine:   Patient denies excessive thirst.  Musculoskeletal:   Patient reports back pain. Patient denies joint pain.  Neurological:   Patient denies headaches and dizziness.  Psychologic:  Patient denies depression and anxiety.   VITAL SIGNS:      11/08/2020 09:41 AM  Weight 230 lb / 104.33 kg  Height 74 in / 187.96 cm  BP 116/73 mmHg  Heart Rate 65 /min  Temperature 97.8 F / 36.5 C  BMI 29.5 kg/m    MULTI-SYSTEM PHYSICAL EXAMINATION:    Constitutional: Well-nourished. No physical deformities. Normally developed. Good grooming.  Respiratory: No labored breathing, no use of accessory muscles.   Cardiovascular: Normal temperature, normal extremity pulses, no swelling, no varicosities.  Skin: No paleness, no jaundice, no cyanosis. No lesion, no ulcer, no rash.  Neurologic / Psychiatric: Oriented to time, oriented to place, oriented to person. No depression, no anxiety, no agitation.  Gastrointestinal: No mass, no tenderness, no rigidity, non obese abdomen. No CVA tenderness bilaterally  Eyes: Normal conjunctivae. Normal eyelids.  Musculoskeletal: Normal gait and station of head and neck.     Complexity of Data:  Source Of History:  Patient  Records Review:   Previous Doctor Records, Previous Patient Records  Urine Test Review:   Urinalysis  X-Ray Review: C.T. Abdomen/Pelvis: Reviewed Films. Reviewed Report. Discussed With Patient.     PROCEDURES:          Urinalysis Dipstick Dipstick Cont'd  Color: Yellow Bilirubin: Neg mg/dL  Appearance: Clear Ketones: Neg mg/dL  Specific Gravity: 9.833 Blood: Neg ery/uL  pH: 6.5 Protein: Trace mg/dL  Glucose: Neg mg/dL Urobilinogen: 0.2 mg/dL    Nitrites: Neg    Leukocyte Esterase: Neg leu/uL    ASSESSMENT:      ICD-10 Details  1 GU:   Ureteral calculus - N20.1 Undiagnosed New Problem  2   Ureteral obstruction secondary to calculous - N13.2 Undiagnosed New Problem   PLAN:            Medications New Meds: Tamsulosin Hcl 0.4 mg capsule 1 capsule PO Daily   #30  3 Refill(s)  Hydrocodone-Acetaminophen 5 mg-325 mg tablet 1 tablet PO Q 6 H PRN pain  #10  0 Refill(s)            Orders Labs Urine Culture          Document Letter(s):  Created for Patient: Clinical Summary         Notes:   plan for cystoscopy with left ureteroscopy, laser lithotripsy, ureteral stent placement. Risks and benefits discussed.   CC: Dr. Patsey Berthold     Signed by Modena Slater, III, M.D. on 11/08/20 at 8:50 PM (EDT

## 2020-11-11 NOTE — Op Note (Signed)
Operative Note  Preoperative diagnosis:  1.  Left ureteropelvic junction calculus  Postoperative diagnosis: 1.  Left ureteropelvic junction calculus  Procedure(s): 1.  Cystoscopy with left retrograde pyelogram, left ureteroscopy with laser lithotripsy and ureteral stent placement  Surgeon: Link Snuffer, MD  Assistants: None  Anesthesia: General  Complications: None immediate  EBL: Minimal  Specimens: 1.  None  Drains/Catheters: 1.  6 x 26 double-J ureteral stent  Intraoperative findings: 1.  Normal urethra and bladder 2.  Left retrograde pyelogram revealed some hydronephrosis.  There was evidence of parapelvic cyst.  Ureteroscopy revealed an approximately 8 mm ureteropelvic junction calculus that was laser fragmented to tiny fragments  Indication: Eric Ayers with a left ureteropelvic junction calculus presents for the previously mentioned operation  Description of procedure:  The patient was identified and consent was obtained.  The patient was taken to the operating room and placed in the supine position.  The patient was placed under general anesthesia.  Perioperative antibiotics were administered.  The patient was placed in dorsal lithotomy.  Patient was prepped and draped in a standard sterile fashion and a timeout was performed.  A 21 French rigid cystoscope was advanced into the urethra and into the bladder.  Complete cystoscopy was performed with no abnormal findings.  The left ureter was cannulated with a sensor wire which was advanced up to the kidney under fluoroscopic guidance.  A second sensor wire was advanced alongside this and the scope withdrawn.  1 the wires was secured to the drape as a safety wire and the other wire was used to advance a 12 x 14 ureteral access sheath up the ureter under continuous fluoroscopic guidance.  There was no resistance met.  The inner sheath along with the wire were withdrawn.  Digital ureteroscopy was performed and the stone was  encountered which was laser fragmented on dust settings to tiny fragments.  I inspected the entire kidney and there were no clinically significant stone fragments identified.  I shot a retrograde pyelogram through the scope with findings noted above.  I withdrew the scope along with the access sheath.  There was some ureteral edema at the area of stone impaction but no obvious ureteral injury along the course of the entire ureter.  There were no other ureteral calculi.  I backloaded the wire onto the rigid cystoscope and advanced that into the bladder followed by routine placement of a 6 x 26 double-J ureteral stent.  Fluoroscopy confirmed proximal placement and direct visualization confirmed a good coil within the bladder.  I drained the bladder and withdrew the scope.  Patient tolerated the procedure well was stable postoperative.  Plan: Follow-up in 1 week for stent removal

## 2020-11-11 NOTE — Transfer of Care (Signed)
Immediate Anesthesia Transfer of Care Note  Patient: Eric Ayers  Procedure(s) Performed: CYSTOSCOPY/URETEROSCOPY/HOLMIUM LASER/STENT PLACEMENT (Left: Ureter)  Patient Location: PACU  Anesthesia Type:General  Level of Consciousness: drowsy  Airway & Oxygen Therapy: Patient Spontanous Breathing and Patient connected to nasal cannula oxygen  Post-op Assessment: Report given to RN  Post vital signs: Reviewed and stable  Last Vitals:  Vitals Value Taken Time  BP 112/78 11/11/20 0935  Temp    Pulse 63 11/11/20 0937  Resp 12 11/11/20 0937  SpO2 100 % 11/11/20 0937  Vitals shown include unvalidated device data.  Last Pain:  Vitals:   11/11/20 0740  TempSrc: Oral  PainSc: 4       Patients Stated Pain Goal: 2 (11/11/20 0740)  Complications: No notable events documented.

## 2020-11-14 ENCOUNTER — Encounter (HOSPITAL_BASED_OUTPATIENT_CLINIC_OR_DEPARTMENT_OTHER): Payer: Self-pay | Admitting: Urology

## 2021-12-16 IMAGING — CT CT ABD-PELV W/ CM
2 of 5 series · 16 of 46 positions shown, 18 images · IV contrast (Omnipaque)
Comparison: March 22, 2018

CLINICAL DATA: Diffuse abdominal pain

EXAM:
CT ABDOMEN AND PELVIS WITH CONTRAST
TECHNIQUE: Multidetector CT imaging of the abdomen and pelvis was performed
using the standard protocol following bolus administration of
intravenous contrast.
CONTRAST:  100mL OMNIPAQUE IOHEXOL 300 MG/ML  SOLN

[Series 2: axial st · axial · 0.91mm/px · z∈[-547,-7]mm · 13 of 120 slices shown, 15 images]
[im 6/120  soft-tissue]
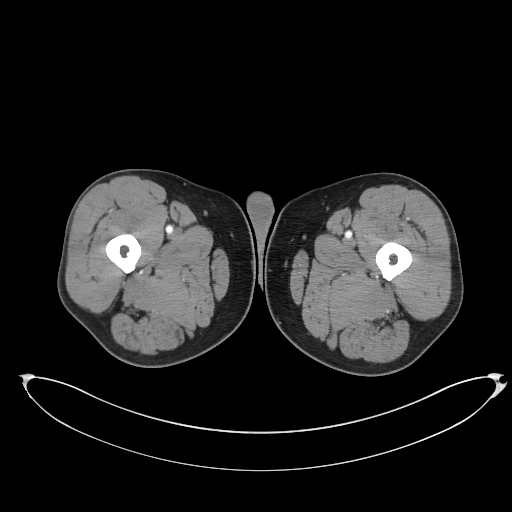
[im 6/120  bone]
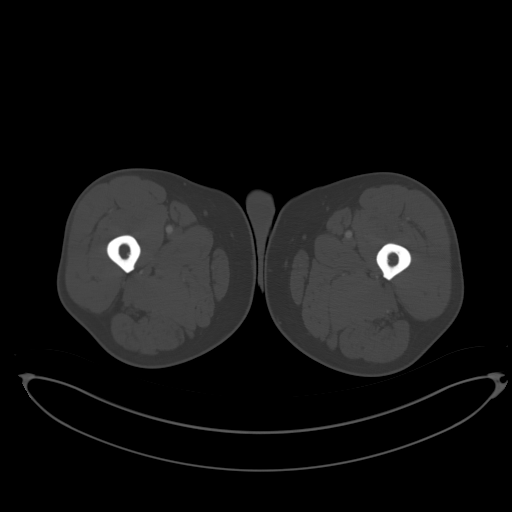
[im 18/120  soft-tissue]
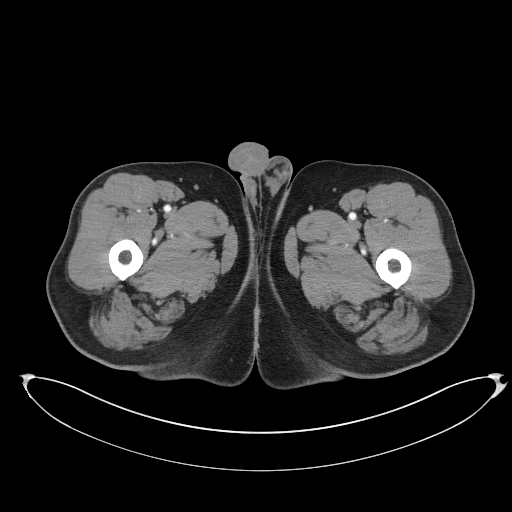
[im 24/120  soft-tissue]
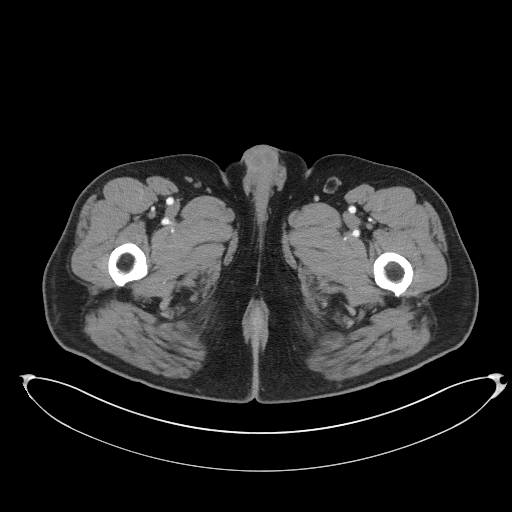
[im 36/120  soft-tissue]
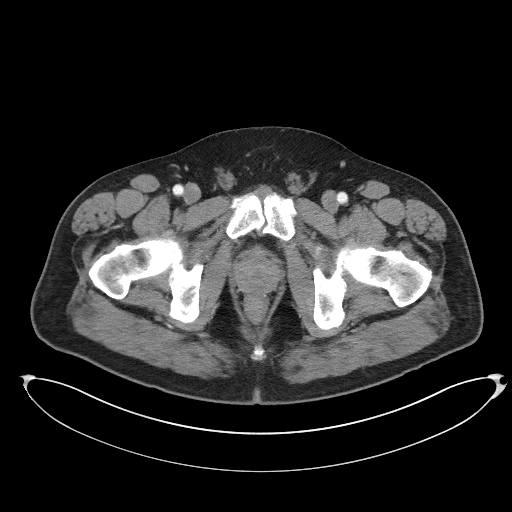
[im 42/120  soft-tissue]
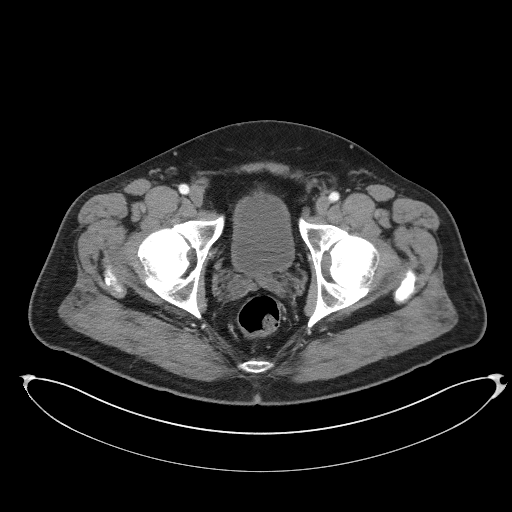
[im 54/120  soft-tissue]
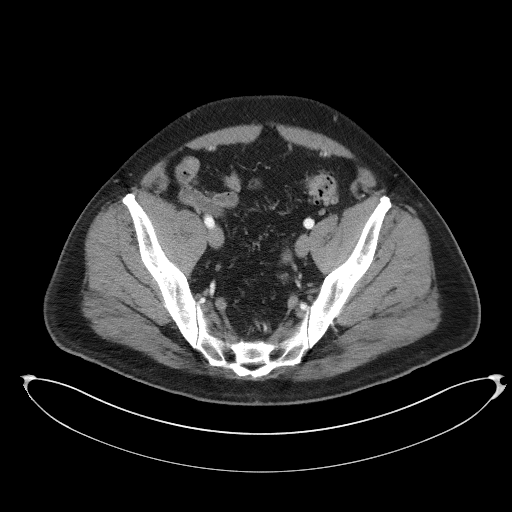
[im 60/120  soft-tissue]
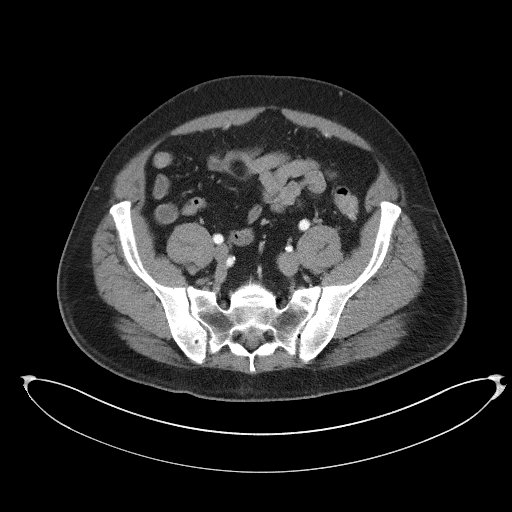
[im 66/120  soft-tissue]
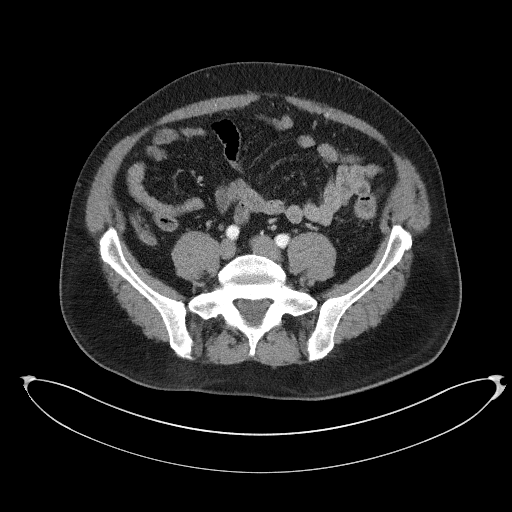
[im 78/120  soft-tissue]
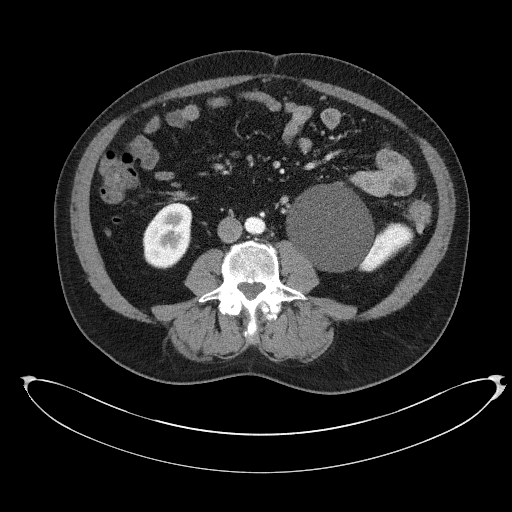
[im 78/120  bone]
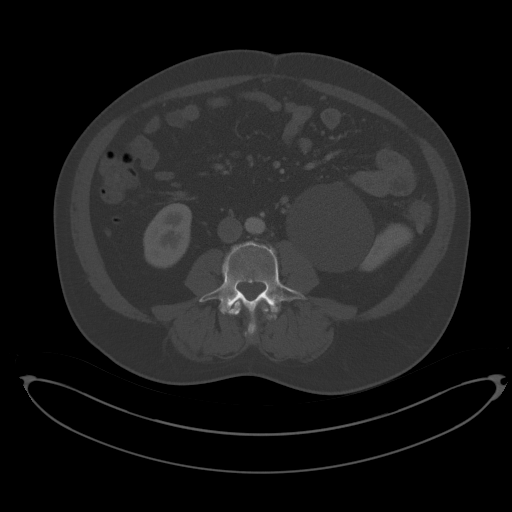
[im 84/120  soft-tissue]
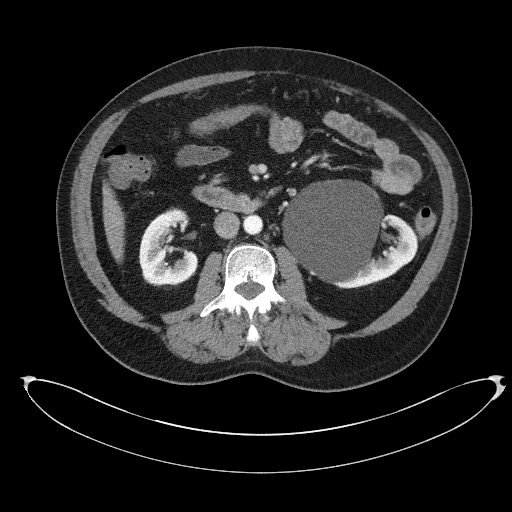
[im 96/120  soft-tissue]
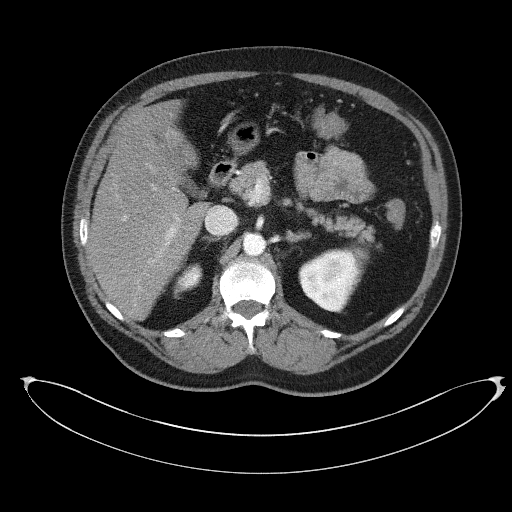
[im 102/120  soft-tissue]
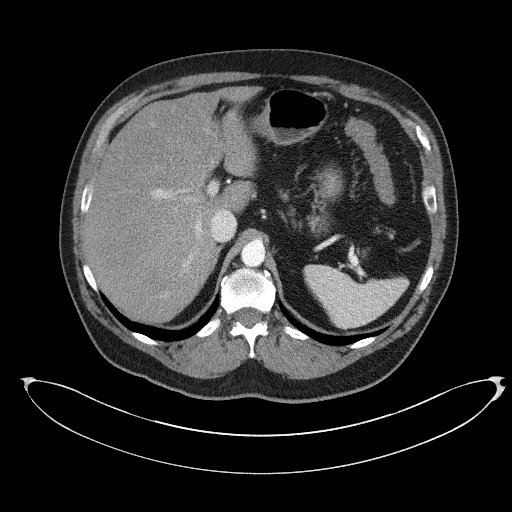
[im 114/120  soft-tissue]
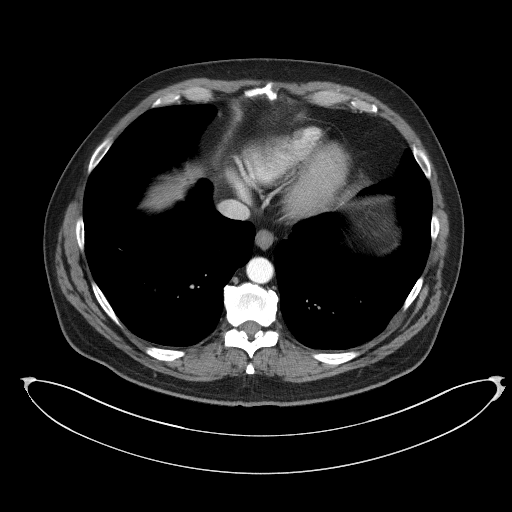

[Series 4: coronal st · coronal · 0.89mm/px · 3 of 117 slices shown]
[im 39/117  soft-tissue]
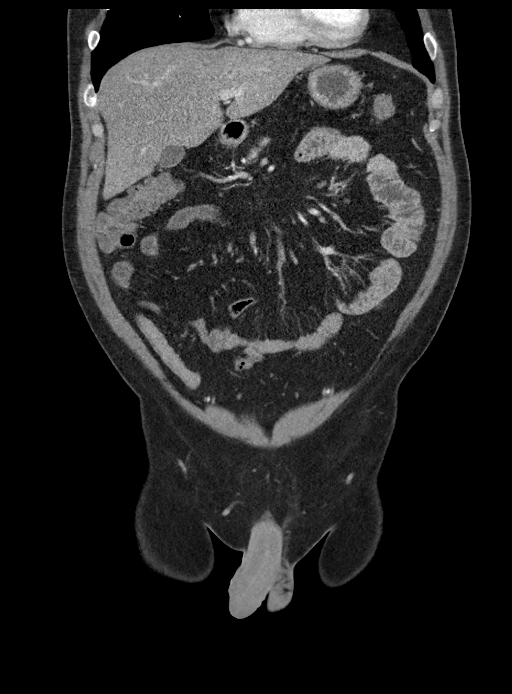
[im 52/117  soft-tissue]
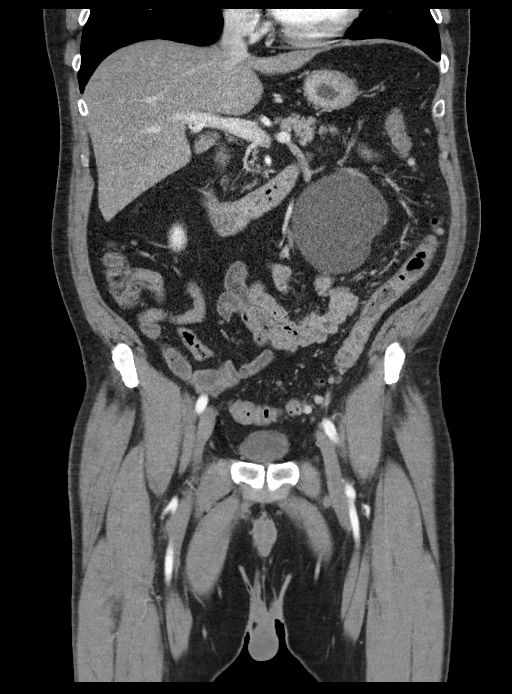
[im 65/117  soft-tissue]
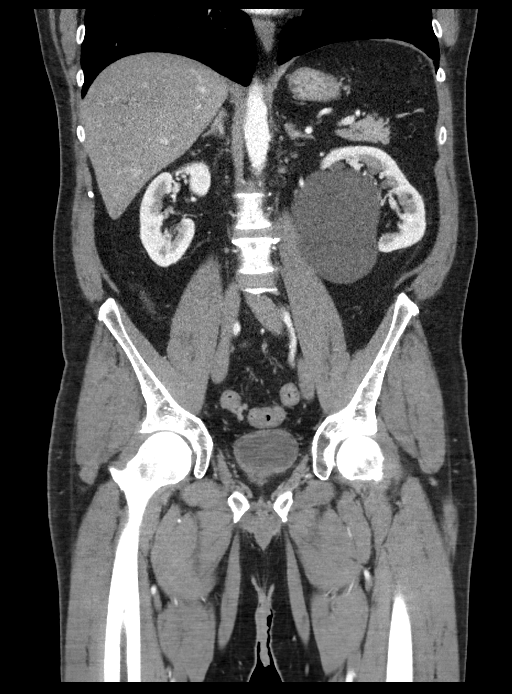

[16 of 46 positions shown; findings below may reference images not displayed]

FINDINGS: Lower chest: The visualized heart size within normal limits. No
pericardial fluid/thickening.

No hiatal hernia.

The visualized portions of the lungs are clear.

Hepatobiliary: There is diffuse low density seen throughout the
liver parenchyma. No focal hepatic lesion is seen. No intra or
extrahepatic biliary ductal dilatation.The main portal vein is
patent. No evidence of calcified gallstones, gallbladder wall
thickening or biliary dilatation.

Pancreas: Unremarkable. No pancreatic ductal dilatation or
surrounding inflammatory changes.

Spleen: Normal in size without focal abnormality.

Adrenals/Urinary Tract: Both adrenal glands appear normal. Again
noted is a stable large low-density lesion within the left kidney
measuring 9 1 x 8.6 cm. There is peripheral calcifications present.
No hydronephrosis. Bladder is unremarkable.

Stomach/Bowel: The stomach, small bowel, and colon are normal in
appearance. No inflammatory changes, wall thickening, or obstructive
findings. Scattered diverticula are noted.There is question of
minimal fat stranding changes seen around the mid descending colon.
No loculated fluid collections or free air. The appendix is
unremarkable.

Vascular/Lymphatic: There are no enlarged mesenteric,
retroperitoneal, or pelvic lymph nodes. No significant vascular
findings are present.

Reproductive: The prostate is unremarkable.

Other: No evidence of abdominal wall mass or hernia.

Musculoskeletal: No acute or significant osseous findings.
IMPRESSION: Findings suggestive mid descending colon mild diverticulitis. No
surrounding loculated fluid collections or free air.

Hepatic steatosis
